# Patient Record
Sex: Female | Born: 1995
Health system: Southern US, Community
[De-identification: ages and names within clinical notes are randomized; demographics above are authoritative.]

## PROBLEM LIST (undated history)

## (undated) DIAGNOSIS — R519 Headache, unspecified: Secondary | ICD-10-CM

## (undated) DIAGNOSIS — R51 Headache: Secondary | ICD-10-CM

## (undated) HISTORY — DX: Headache: R51

## (undated) HISTORY — DX: Headache, unspecified: R51.9

---
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
## 2017-04-27 ENCOUNTER — Emergency Department
Admission: EM | Admit: 2017-04-27 | Discharge: 2017-04-27 | Disposition: A | Discharge status: Home / Self Care | Payer: 59 | Attending: Emergency Medicine | Admitting: Emergency Medicine

## 2017-04-27 ENCOUNTER — Emergency Department: Payer: 59

## 2017-04-27 DIAGNOSIS — R079 Chest pain, unspecified: Secondary | ICD-10-CM | POA: Insufficient documentation

## 2017-04-27 DIAGNOSIS — R0789 Other chest pain: Secondary | ICD-10-CM | POA: Diagnosis not present

## 2017-04-27 LAB — CBC
HCT: 33.8 % — ABNORMAL LOW (ref 35.0–47.0)
Hemoglobin: 10.8 g/dL — ABNORMAL LOW (ref 12.0–16.0)
MCH: 21 pg — ABNORMAL LOW (ref 26.0–34.0)
MCHC: 32.1 g/dL (ref 32.0–36.0)
MCV: 65.6 fL — ABNORMAL LOW (ref 80.0–100.0)
PLATELETS: 360 10*3/uL (ref 150–440)
RBC: 5.15 MIL/uL (ref 3.80–5.20)
RDW: 20.1 % — ABNORMAL HIGH (ref 11.5–14.5)
WBC: 5 10*3/uL (ref 3.6–11.0)

## 2017-04-27 LAB — BASIC METABOLIC PANEL
ANION GAP: 7 (ref 5–15)
BUN: 17 mg/dL (ref 6–20)
CALCIUM: 9.2 mg/dL (ref 8.9–10.3)
CO2: 25 mmol/L (ref 22–32)
CREATININE: 0.75 mg/dL (ref 0.44–1.00)
Chloride: 106 mmol/L (ref 101–111)
Glucose, Bld: 95 mg/dL (ref 65–99)
Potassium: 3.8 mmol/L (ref 3.5–5.1)
SODIUM: 138 mmol/L (ref 135–145)

## 2017-04-27 LAB — TROPONIN I

## 2017-04-27 NOTE — ED Triage Notes (Signed)
Pt c/o left sided squeezing chest pain intermittently for past week. Pt reports pain worsening when she yells. Pt alert and oriented X4, active, cooperative, pt in NAD. RR even and unlabored, color WNL.

## 2017-04-27 NOTE — ED Notes (Signed)
Pt states L sided CP x weeks. "I was supposed to come up here last week but I didn't" No distress noted. Denies SOB, N&V, diaphoresis. States chronic back pain. Alert, oriented, ambulatory. States pain worse when she yells or talks.

## 2017-04-27 NOTE — Discharge Instructions (Signed)
You have been seen in the emergency department today for chest pain. Your workup has shown normal results. As we discussed please follow-up with your primary care physician in the next 1-2 days for recheck. Return to the emergency department for any further chest pain, trouble breathing, or any other symptom personally concerning to yourself. °

## 2017-04-27 NOTE — ED Provider Notes (Signed)
[facility] Emergency Department Provider Note  Time seen: 3:10 PM  I have reviewed the triage vital signs and the nursing notes.   HISTORY  Chief Complaint Chest Pain    HPI Marissa Juarez is a 21 y.o. female with no past medical history who presents to the emergency department for left-sided chest discomfort. According to the patient for the past 1 week she's been having left-sided chest pain that comes and goes. Denies any cough or congestion. Denies any fever. Denies any abdominal pain, nausea or vomiting or diarrhea. Denies any leg pain or swelling. As the pain she has been experienced has mild in the left chest only. But currently gone.   History reviewed. No pertinent past medical history.  There are no active problems to display for this patient.   History reviewed. No pertinent surgical history.  Prior to Admission medications   Not on File    Allergies  Allergen Reactions  . Penicillins Rash    No family history on file.  Social History Social History  Substance Use Topics  . Smoking status: Never Smoker  . Smokeless tobacco: Never Used  . Alcohol use No    Review of Systems Constitutional: Negative for fever. Cardiovascular: Positive for left chest pain. Respiratory: Negative for shortness of breath. Gastrointestinal: Negative for abdominal pain Musculoskeletal: Negative for leg pain or swelling. Neurological: Negative for headache All other ROS negative  ____________________________________________   PHYSICAL EXAM:  VITAL SIGNS: ED Triage Vitals  Enc Vitals Group     BP 04/27/17 1219 111/69     Pulse Rate 04/27/17 1219 83     Resp 04/27/17 1219 18     Temp 04/27/17 1219 99 F (37.2 C)     Temp Source 04/27/17 1219 Oral     SpO2 04/27/17 1219 100 %     Weight 04/27/17 1220 146 lb (66.2 kg)     Height 04/27/17 1220 5\' 6"  (1.676 m)     Head Circumference --      Peak Flow --      Pain Score 04/27/17 1219 9      Pain Loc --      Pain Edu? --      Excl. in GC? --     Constitutional: Alert and oriented. Well appearing and in no distress. Eyes: Normal exam ENT   Head: Normocephalic and atraumatic.   Mouth/Throat: Mucous membranes are moist. Cardiovascular: Normal rate, regular rhythm. No murmur Respiratory: Normal respiratory effort without tachypnea nor retractions. Breath sounds are clear. Moderate left-sided chest tenderness with palpation/compression. No right-sided tenderness.  Gastrointestinal: Soft and nontender. No distention.  Musculoskeletal: Nontender with normal range of motion in all extremities. No lower extremity tenderness or edema. Neurologic:  Normal speech and language. No gross focal neurologic deficits Skin:  Skin is warm, dry and intact.  Psychiatric: Mood and affect are normal.   ____________________________________________    EKG  EKG reviewed and interpreted by myself shows normal sinus rhythm at 84 bpm, narrow QRS, normal axis, normal intervals, no ST changes. Normal EKG.  ____________________________________________    RADIOLOGY  Chest x-ray negative  ____________________________________________   INITIAL IMPRESSION / ASSESSMENT AND PLAN / ED COURSE  Pertinent labs & imaging results that were available during my care of the patient were reviewed by me and considered in my medical decision making (see chart for details).  Patient presents to the emergency department with intermittent left chest pain times one week. None currently. Overall the patient  appears very well with moderate reproducible left-sided chest discomfort. Normal workup including cardiac enzyme. Negative chest x-ray, normal EKG. Discussed likelihood of chest wall/muscle skeletal discomfort and recommended she take ibuprofen at home. Patient agreeable to this plan.  ____________________________________________   FINAL CLINICAL IMPRESSION(S) / ED DIAGNOSES  Left chest pain     Minna AntisPaduchowski, Carmell Elgin, MD 04/27/17 1512

## 2017-07-06 DIAGNOSIS — Z3200 Encounter for pregnancy test, result unknown: Secondary | ICD-10-CM | POA: Diagnosis not present

## 2017-07-06 DIAGNOSIS — Z23 Encounter for immunization: Secondary | ICD-10-CM | POA: Diagnosis not present

## 2017-07-06 DIAGNOSIS — Z202 Contact with and (suspected) exposure to infections with a predominantly sexual mode of transmission: Secondary | ICD-10-CM | POA: Diagnosis not present

## 2017-07-06 DIAGNOSIS — Z309 Encounter for contraceptive management, unspecified: Secondary | ICD-10-CM | POA: Diagnosis not present

## 2017-07-06 DIAGNOSIS — Z1159 Encounter for screening for other viral diseases: Secondary | ICD-10-CM | POA: Diagnosis not present

## 2017-09-23 DIAGNOSIS — Z1389 Encounter for screening for other disorder: Secondary | ICD-10-CM | POA: Diagnosis not present

## 2017-09-23 DIAGNOSIS — Z3200 Encounter for pregnancy test, result unknown: Secondary | ICD-10-CM | POA: Diagnosis not present

## 2017-09-23 DIAGNOSIS — Z113 Encounter for screening for infections with a predominantly sexual mode of transmission: Secondary | ICD-10-CM | POA: Diagnosis not present

## 2017-09-23 DIAGNOSIS — Z309 Encounter for contraceptive management, unspecified: Secondary | ICD-10-CM | POA: Diagnosis not present

## 2017-09-23 DIAGNOSIS — G44229 Chronic tension-type headache, not intractable: Secondary | ICD-10-CM | POA: Diagnosis not present

## 2017-10-03 ENCOUNTER — Encounter: Payer: Self-pay | Admitting: Neurology

## 2017-10-28 DIAGNOSIS — M9903 Segmental and somatic dysfunction of lumbar region: Secondary | ICD-10-CM | POA: Diagnosis not present

## 2017-10-28 DIAGNOSIS — M9905 Segmental and somatic dysfunction of pelvic region: Secondary | ICD-10-CM | POA: Diagnosis not present

## 2017-10-28 DIAGNOSIS — M5416 Radiculopathy, lumbar region: Secondary | ICD-10-CM | POA: Diagnosis not present

## 2017-10-28 DIAGNOSIS — M955 Acquired deformity of pelvis: Secondary | ICD-10-CM | POA: Diagnosis not present

## 2017-12-23 DIAGNOSIS — Z309 Encounter for contraceptive management, unspecified: Secondary | ICD-10-CM | POA: Diagnosis not present

## 2018-01-17 ENCOUNTER — Ambulatory Visit (INDEPENDENT_AMBULATORY_CARE_PROVIDER_SITE_OTHER): Payer: 59 | Admitting: Neurology

## 2018-01-17 ENCOUNTER — Encounter: Payer: Self-pay | Admitting: Neurology

## 2018-01-17 VITALS — BP 106/70 | HR 90 | Ht 68.0 in | Wt 138.0 lb

## 2018-01-17 DIAGNOSIS — G43009 Migraine without aura, not intractable, without status migrainosus: Secondary | ICD-10-CM | POA: Diagnosis not present

## 2018-01-17 MED ORDER — NORTRIPTYLINE HCL 10 MG PO CAPS: 10.0000 mg | ORAL_CAPSULE | Freq: Every day | ORAL | 2 refills | Status: DC

## 2018-01-17 NOTE — Patient Instructions (Signed)
Migraine Recommendations: 1.  Start nortriptyline 10mg  at bedtime.  If headaches not improved, call in 4 weeks with update and we can adjust dose if needed. 2.  Take either Excedrin, ibuprofen or Tylenol as needed but limit use of pain relievers to no more than 2 days out of week (5 days out of week, you shouldn't take a pain reliever). 3.  Limit use of pain relievers to no more than 2 days out of the week.  These medications include acetaminophen, ibuprofen, triptans and narcotics.  This will help reduce risk of rebound headaches. 4.  Be aware of common food triggers such as processed sweets, processed foods with nitrites (such as deli meat, hot dogs, sausages), foods with MSG, alcohol (such as wine), chocolate, certain cheeses, certain fruits (dried fruits, bananas, some citrus fruit), vinegar, diet soda. 4.  Avoid caffeine 5.  Routine exercise 6.  Proper sleep hygiene 7.  Stay adequately hydrated with water 8.  Keep a headache diary. 9.  Maintain proper stress management. 10.  Do not skip meals. 11.  Consider supplements:  Magnesium citrate 400mg  to 600mg  daily, riboflavin 400mg , Coenzyme Q 10 100mg  three times daily 12.  Follow up in 3 months.

## 2018-01-17 NOTE — Progress Notes (Signed)
NEUROLOGY CONSULTATION NOTE  Marissa PillowLeiasia N Gerald MRN: [id number] DOB: [birth year]  Referring provider: Toy CookeyEmily Headrick, FNP Primary care provider: Toy CookeyEmily Headrick, FNP  Reason for consult:  headache  HISTORY OF PRESENT ILLNESS: Marissa Juarez is a 22 year old female who presents for headache.  History supplemented by referring provider's note.  Onset:  Since childhood but has increased in frequency over past few months. Location:  Left frontal region (above left eye) Quality:  Throbbing/pounding Intensity:  Severe.  she denies new headache, thunderclap headache or severe headache that wakes her from sleep. Aura:  no Prodrome:  no Postdrome:  no Associated symptoms:  Nausea, photophobia, phonophobia.  Rarely associated with vomiting.  Rarely associated with blurred vision only while driving.  She denies associated unilateral numbness or weakness. Duration:  2 hours Frequency:  Initially once a week but daily over past 1 to 2 months Frequency of abortive medication: Excedrin daily Triggers/exacerbating factors:  Unknown but possibly stress.  Started Depo-Provera around January. Relieving factors:  sleep Activity:  aggravates  Current NSAIDS:  ibuprofen Current analgesics:  Excedrin, Tylenol Current triptans:  no Current anti-emetic:  no Current muscle relaxants:  no Current anti-anxiolytic:  no Current sleep aide:  no Current Antihypertensive medications:  no Current Antidepressant medications:  no Current Anticonvulsant medications:  no Current Vitamins/Herbal/Supplements:  no Current Antihistamines/Decongestants:  no Other therapy:  no Birth Control:  Depo-Provera  Past NSAIDS:  no Past analgesics:  no Past abortive triptans:  no Past muscle relaxants:  no Past anti-emetic:  no Past antihypertensive medications:  no Past antidepressant medications:  no Past anticonvulsant medications:  no Past vitamins/Herbal/Supplements:  no Past antihistamines/decongestants:   no Other past therapies:  no  Caffeine:  Occasional coffee Alcohol:  no Smoker:  no Diet:  Drinks 16 oz water daily.  Does not drink soda Exercise:  no Depression:  no; Anxiety:  no Other pain:  Sometimes back pain Sleep hygiene:  good Family history of headache:  Mom, cousins have migraines  Labs over past year: 04/27/17 BMP with Na 138, K 3.8, Cl 106, Co2 25, glucose 95, BUN 17 and Cr 0.75  PAST MEDICAL HISTORY: Past Medical History:  Diagnosis Date  . Headache     PAST SURGICAL HISTORY: History reviewed. No pertinent surgical history.  MEDICATIONS: Current Outpatient Medications on File Prior to Visit  Medication Sig Dispense Refill  . estradiol cypionate (DEPO-ESTRADIOL) 5 MG/ML injection Inject into the muscle every 28 (twenty-eight) days.     No current facility-administered medications on file prior to visit.     ALLERGIES: Allergies  Allergen Reactions  . Penicillins Rash    FAMILY HISTORY: Family History  Problem Relation Age of Onset  . Heart murmur Sister   . Epilepsy Brother   . Dementia Maternal Grandmother   . Diabetes Maternal Grandfather   . Hypertension Paternal Grandmother   . Diabetes Paternal Grandmother   . Diabetes Paternal Grandfather     SOCIAL HISTORY: Social History   Socioeconomic History  . Marital status: Single    Spouse name: Not on file  . Number of children: Not on file  . Years of education: Not on file  . Highest education level: Some college, no degree  Occupational History  . Occupation: Consulting civil engineerstudent  Social Needs  . Financial resource strain: Not on file  . Food insecurity:    Worry: Not on file    Inability: Not on file  . Transportation needs:    Medical: Not on file  Non-medical: Not on file  Tobacco Use  . Smoking status: Never Smoker  . Smokeless tobacco: Never Used  Substance and Sexual Activity  . Alcohol use: No  . Drug use: Never  . Sexual activity: Not on file  Lifestyle  . Physical activity:     Days per week: Not on file    Minutes per session: Not on file  . Stress: Not on file  Relationships  . Social connections:    Talks on phone: Not on file    Gets together: Not on file    Attends religious service: Not on file    Active member of club or organization: Not on file    Attends meetings of clubs or organizations: Not on file    Relationship status: Not on file  . Intimate partner violence:    Fear of current or ex partner: Not on file    Emotionally abused: Not on file    Physically abused: Not on file    Forced sexual activity: Not on file  Other Topics Concern  . Not on file  Social History Narrative   Pt is left handed. She is single, lives with her mother, brother and sister. Drinks coffee and tea occasionally. No regular exercise recently. She is in school, studying for LPN.    REVIEW OF SYSTEMS: Constitutional: No fevers, chills, or sweats, no generalized fatigue, change in appetite Eyes: No visual changes, double vision, eye pain Ear, nose and throat: No hearing loss, ear pain, nasal congestion, sore throat Cardiovascular: No chest pain, palpitations Respiratory:  No shortness of breath at rest or with exertion, wheezes GastrointestinaI: No nausea, vomiting, diarrhea, abdominal pain, fecal incontinence Genitourinary:  No dysuria, urinary retention or frequency Musculoskeletal:  No neck pain, back pain Integumentary: No rash, pruritus, skin lesions Neurological: as above Psychiatric: No depression, insomnia, anxiety Endocrine: No palpitations, fatigue, diaphoresis, mood swings, change in appetite, change in weight, increased thirst Hematologic/Lymphatic:  No purpura, petechiae. Allergic/Immunologic: no itchy/runny eyes, nasal congestion, recent allergic reactions, rashes  PHYSICAL EXAM: Vitals:   01/17/18 0806  BP: 106/70  Pulse: 90  SpO2: 98%   General: No acute distress.  Patient appears well-groomed.  Head:  Normocephalic/atraumatic Eyes:  fundi  examined but not visualized Neck: supple, no paraspinal tenderness, full range of motion Back: No paraspinal tenderness Heart: regular rate and rhythm Lungs: Clear to auscultation bilaterally. Vascular: No carotid bruits. Neurological Exam: Mental status: alert and oriented to person, place, and time, recent and remote memory intact, fund of knowledge intact, attention and concentration intact, speech fluent and not dysarthric, language intact. Cranial nerves: CN I: not tested CN II: pupils equal, round and reactive to light, visual fields intact CN III, IV, VI:  full range of motion, no nystagmus, no ptosis CN V: facial sensation intact CN VII: upper and lower face symmetric CN VIII: hearing intact CN IX, X: gag intact, uvula midline CN XI: sternocleidomastoid and trapezius muscles intact CN XII: tongue midline Bulk & Tone: normal, no fasciculations. Motor:  5/5 throughout  Sensation: temperature and vibration sensation intact. Deep Tendon Reflexes:  2+ throughout, toes downgoing.  Finger to nose testing:  Without dysmetria.  Heel to shin:  Without dysmetria.  Gait:  Normal station and stride.  Able to turn and tandem walk. Romberg negative.  IMPRESSION: Migraine without aura, increased frequency over past several weeks.  Possibly triggered by stress.  Also consider Depo a trigger.  PLAN: At this time, she would like to start a daily  preventative. 1.  Start nortriptyline 10mg  at bedtime.  If headaches not improved, call in 4 weeks with update and we can adjust dose if needed. 2.  Take either Excedrin, ibuprofen or Tylenol as needed but limit use of pain relievers to no more than 2 days out of week to prevent rebound headache. 3.  Be aware of common food triggers such as processed sweets, processed foods with nitrites (such as deli meat, hot dogs, sausages), foods with MSG, alcohol (such as wine), chocolate, certain cheeses, certain fruits (dried fruits, bananas, some citrus fruit),  vinegar, diet soda. 4.  Avoid caffeine 5.  Routine exercise 6.  Proper sleep hygiene 7.  Stay adequately hydrated with water 8.  Keep a headache diary. 9.  Maintain proper stress management. 10.  Do not skip meals. 11.  Consider supplements:  Magnesium citrate 400mg  to 600mg  daily, riboflavin 400mg , Coenzyme Q 10 100mg  three times daily 12.  Follow up in 3 months.  Thank you for allowing me to take part in the care of this patient.  Shon Millet, DO  CC:  Toy Cookey, FNP

## 2018-01-24 ENCOUNTER — Encounter: Payer: Self-pay | Admitting: Neurology

## 2018-02-13 ENCOUNTER — Telehealth: Payer: Self-pay | Admitting: Neurology

## 2018-02-13 DIAGNOSIS — G43009 Migraine without aura, not intractable, without status migrainosus: Secondary | ICD-10-CM

## 2018-02-13 NOTE — Telephone Encounter (Signed)
LMOVM for Pt to rtrn the call

## 2018-02-13 NOTE — Telephone Encounter (Signed)
Patient needs to talk to someone about medication dosage and the headaches she is having. Patient is taking nortiptyline

## 2018-02-15 MED ORDER — NORTRIPTYLINE HCL 25 MG PO CAPS
25.0000 mg | ORAL_CAPSULE | Freq: Every day | ORAL | 3 refills | Status: DC
Start: 2018-02-15 — End: 2018-05-04

## 2018-02-15 NOTE — Telephone Encounter (Signed)
Called and spoke with Pt. She wanted to increase nortriptyline as discussed at last visit. Spoke with Dr Everlena Cooper, he recommended increasing to 25 mg, advised Pt, sent to [facility] on [address] in [place]

## 2018-02-15 NOTE — Addendum Note (Signed)
Addended by: Dorthy Cooler on: 02/15/2018 03:51 PM   Modules accepted: Orders

## 2018-03-13 DIAGNOSIS — Z309 Encounter for contraceptive management, unspecified: Secondary | ICD-10-CM | POA: Diagnosis not present

## 2018-05-04 ENCOUNTER — Encounter: Payer: Self-pay | Admitting: Neurology

## 2018-05-04 ENCOUNTER — Ambulatory Visit (INDEPENDENT_AMBULATORY_CARE_PROVIDER_SITE_OTHER): Payer: 59 | Admitting: Neurology

## 2018-05-04 VITALS — BP 112/74 | HR 104 | Ht 67.75 in | Wt 151.0 lb

## 2018-05-04 DIAGNOSIS — G43709 Chronic migraine without aura, not intractable, without status migrainosus: Secondary | ICD-10-CM | POA: Diagnosis not present

## 2018-05-04 MED ORDER — NORTRIPTYLINE HCL 50 MG PO CAPS: 50.0000 mg | ORAL_CAPSULE | Freq: Every day | ORAL | 3 refills | Status: DC

## 2018-05-04 NOTE — Progress Notes (Signed)
NEUROLOGY FOLLOW UP OFFICE NOTE  Marissa Juarez [id number]  HISTORY OF PRESENT ILLNESS: Marissa Juarez is a 22 year old female who follows up for migraine.  UPDATE: Nortriptyline started in April and was subsequently increased to 25mg  at bedtime. Intensity:  severe Duration:  2 hours Frequency:  Every other day Frequency of abortive medication: Excedrin once a week Current NSAIDS:  no Current analgesics:  Excedrin Current triptans:  no Current anti-emetic:  no Current muscle relaxants:  no Current anti-anxiolytic:  no Current sleep aide:  no Current Antihypertensive medications:  no Current Antidepressant medications:  nortriptyline 25mg  Current Anticonvulsant medications:  no Current Vitamins/Herbal/Supplements:  no Current Antihistamines/Decongestants:  no Other therapy:  no Birth Control:  Depo-Provera  Caffeine:  Occasional coffee Alcohol:  no Smoker:  no Diet:  Drinks 16 oz water daily.  Does not drink soda Exercise:  no Depression:  no; Anxiety:  no Other pain:  Sometimes back pain Sleep hygiene:  good  HISTORY:  Onset:  Since childhood but has increased in frequency over past few months. Location:  Left frontal region (above left eye) Quality:  Throbbing/pounding Initial Intensity:  Severe.  she denies new headache, thunderclap headache or severe headache that wakes her from sleep. Aura:  no Prodrome:  no Postdrome:  no Associated symptoms:  Nausea, photophobia, phonophobia.  Rarely associated with vomiting.  Rarely associated with blurred vision only while driving.  She denies associated unilateral numbness or weakness. Initial Duration:  2 hours Initial Frequency:  Initially once a week but daily over past 1 to 2 months Initial Frequency of abortive medication: Excedrin daily Triggers/exacerbating factors:  Unknown but possibly stress.  Started Depo-Provera around January. Relieving factors:  sleep Activity:  aggravates    Past NSAIDS:  no Past  analgesics:  no Past abortive triptans:  no Past muscle relaxants:  no Past anti-emetic:  no Past antihypertensive medications:  no Past antidepressant medications:  no Past anticonvulsant medications:  no Past vitamins/Herbal/Supplements:  no Past antihistamines/decongestants:  no Other past therapies:  no   Family history of headache:  Mom, cousins have migraines  PAST MEDICAL HISTORY: Past Medical History:  Diagnosis Date  . Headache     MEDICATIONS: Current Outpatient Medications on File Prior to Visit  Medication Sig Dispense Refill  . estradiol cypionate (DEPO-ESTRADIOL) 5 MG/ML injection Inject into the muscle every 28 (twenty-eight) days.     No current facility-administered medications on file prior to visit.     ALLERGIES: Allergies  Allergen Reactions  . Penicillins Rash    FAMILY HISTORY: Family History  Problem Relation Age of Onset  . Heart murmur Sister   . Epilepsy Brother   . Dementia Maternal Grandmother   . Diabetes Maternal Grandfather   . Hypertension Paternal Grandmother   . Diabetes Paternal Grandmother   . Diabetes Paternal Grandfather     SOCIAL HISTORY: Social History   Socioeconomic History  . Marital status: Single    Spouse name: Not on file  . Number of children: Not on file  . Years of education: Not on file  . Highest education level: Some college, no degree  Occupational History  . Occupation: Consulting civil engineerstudent  Social Needs  . Financial resource strain: Not on file  . Food insecurity:    Worry: Not on file    Inability: Not on file  . Transportation needs:    Medical: Not on file    Non-medical: Not on file  Tobacco Use  . Smoking status: Never  Smoker  . Smokeless tobacco: Never Used  Substance and Sexual Activity  . Alcohol use: No  . Drug use: Never  . Sexual activity: Not on file  Lifestyle  . Physical activity:    Days per week: Not on file    Minutes per session: Not on file  . Stress: Not on file  Relationships    . Social connections:    Talks on phone: Not on file    Gets together: Not on file    Attends religious service: Not on file    Active member of club or organization: Not on file    Attends meetings of clubs or organizations: Not on file    Relationship status: Not on file  . Intimate partner violence:    Fear of current or ex partner: Not on file    Emotionally abused: Not on file    Physically abused: Not on file    Forced sexual activity: Not on file  Other Topics Concern  . Not on file  Social History Narrative   Pt is left handed. She is single, lives with her mother, brother and sister. Drinks coffee and tea occasionally. No regular exercise recently. She is in school, studying for LPN.    REVIEW OF SYSTEMS: Constitutional: No fevers, chills, or sweats, no generalized fatigue, change in appetite Eyes: No visual changes, double vision, eye pain Ear, nose and throat: No hearing loss, ear pain, nasal congestion, sore throat Cardiovascular: No chest pain, palpitations Respiratory:  No shortness of breath at rest or with exertion, wheezes GastrointestinaI: No nausea, vomiting, diarrhea, abdominal pain, fecal incontinence Genitourinary:  No dysuria, urinary retention or frequency Musculoskeletal:  No neck pain, back pain Integumentary: No rash, pruritus, skin lesions Neurological: as above Psychiatric: No depression, insomnia, anxiety Endocrine: No palpitations, fatigue, diaphoresis, mood swings, change in appetite, change in weight, increased thirst Hematologic/Lymphatic:  No purpura, petechiae. Allergic/Immunologic: no itchy/runny eyes, nasal congestion, recent allergic reactions, rashes  PHYSICAL EXAM: Vitals:   05/04/18 0943  BP: 112/74  Pulse: (!) 104  SpO2: 98%   General: No acute distress.  Patient appears well-groomed.  normal body habitus. Head:  Normocephalic/atraumatic Eyes:  Fundi examined but not visualized Neck: supple, no paraspinal tenderness, full range  of motion Heart:  Regular rate and rhythm Lungs:  Clear to auscultation bilaterally Back: No paraspinal tenderness Neurological Exam: alert and oriented to person, place, and time. Attention span and concentration intact, recent and remote memory intact, fund of knowledge intact.  Speech fluent and not dysarthric, language intact.  CN II-XII intact. Bulk and tone normal, muscle strength 5/5 throughout.  Sensation to light touch  intact.  Deep tendon reflexes 2+ throughout.  Finger to nose testing intact.  Gait normal  IMPRESSION: Chronic migraine without aura, without status migrainosus, not intractable  PLAN: 1.  Increase nortriptyline to 50mg  at bedtime.  Contact me in 4 weeks with update and we can adjust dose if needed. 2.  Excedrin for abortive therapy 3.  Limit use of pain relievers to no more than 2 days out of the week.  These medications include acetaminophen, ibuprofen, triptans and narcotics.  This will help reduce risk of rebound headaches. 4.  Be aware of common food triggers such as processed sweets, processed foods with nitrites (such as deli meat, hot dogs, sausages), foods with MSG, alcohol (such as wine), chocolate, certain cheeses, certain fruits (dried fruits, bananas, some citrus fruit), vinegar, diet soda. 4.  Avoid caffeine 5.  Routine exercise 6.  Proper sleep hygiene 7.  Stay adequately hydrated with water 8.  Keep a headache diary. 9.  Maintain proper stress management. 10.  Do not skip meals. 11.  Consider supplements:  Magnesium citrate 400mg  to 600mg  daily, riboflavin 400mg , Coenzyme Q 10 100mg  three times daily 12.  Follow up in 3 to 4 months.  Shon Millet, DO

## 2018-05-04 NOTE — Patient Instructions (Signed)
Migraine Recommendations: 1.  Increase nortriptyline to 50mg  at bedtime.  New prescription ordered.  Contact me in 4 weeks with update and we can adjust dose or switch medication if needed. 2.  Take Excedrin for migraines if needed, otherwise just rest 3.  Limit use of pain relievers to no more than 2 days out of the week.  These medications include acetaminophen, ibuprofen, triptans and narcotics.  This will help reduce risk of rebound headaches. 4.  Be aware of common food triggers such as processed sweets, processed foods with nitrites (such as deli meat, hot dogs, sausages), foods with MSG, alcohol (such as wine), chocolate, certain cheeses, certain fruits (dried fruits, bananas, some citrus fruit), vinegar, diet soda. 4.  Avoid caffeine 5.  Routine exercise 6.  Proper sleep hygiene 7.  Stay adequately hydrated with water 8.  Keep a headache diary. 9.  Maintain proper stress management. 10.  Do not skip meals. 11.  Consider supplements:  Magnesium citrate 400mg  to 600mg  daily, riboflavin 400mg , Coenzyme Q 10 100mg  three times daily 12.  Follow up in 3 to 4 months

## 2018-06-08 DIAGNOSIS — Z309 Encounter for contraceptive management, unspecified: Secondary | ICD-10-CM | POA: Diagnosis not present

## 2018-08-07 ENCOUNTER — Ambulatory Visit: Payer: 59 | Admitting: Neurology

## 2018-08-14 ENCOUNTER — Encounter: Payer: Self-pay | Admitting: Neurology

## 2018-08-14 ENCOUNTER — Ambulatory Visit (INDEPENDENT_AMBULATORY_CARE_PROVIDER_SITE_OTHER): Payer: 59 | Admitting: Neurology

## 2018-08-14 VITALS — BP 118/72 | HR 116 | Ht 68.0 in | Wt 167.0 lb

## 2018-08-14 DIAGNOSIS — G43009 Migraine without aura, not intractable, without status migrainosus: Secondary | ICD-10-CM

## 2018-08-14 MED ORDER — NORTRIPTYLINE HCL 75 MG PO CAPS: 75.0000 mg | ORAL_CAPSULE | Freq: Every day | ORAL | 0 refills | Status: DC

## 2018-08-14 NOTE — Progress Notes (Signed)
NEUROLOGY FOLLOW UP OFFICE NOTE  Marissa Juarez [id number]  HISTORY OF PRESENT ILLNESS: Marissa Juarez is a 22 year old female who follows up for migraine.  UPDATE: Intensity:  severe Duration:  2 hours Frequency:  2-3 days a week Frequency of abortive medication: Excedrin once a week Current NSAIDS:  no Current analgesics:  Excedrin Current triptans:  no Current anti-emetic:  no Current muscle relaxants:  no Current anti-anxiolytic:  no Current sleep aide:  no Current Antihypertensive medications:  no Current Antidepressant medications:  nortriptyline 50mg  Current Anticonvulsant medications:  no Current Vitamins/Herbal/Supplements:  no Current Antihistamines/Decongestants:  no Other therapy:  no Birth Control:  Depo-Provera  Caffeine:  Occasional coffee Alcohol:  no Smoker:  no Diet:  Drinks 32 oz water daily.  Does not drink soda Exercise:  no Depression:  no; Anxiety:  no Other pain:  Sometimes back pain Sleep hygiene:  good  HISTORY: Onset: Since childhood but has increased in frequency over past few months. Location: Left frontal region (above left eye) Quality:  Throbbing/pounding Initial Intensity:  Severe.  she denies new headache, thunderclap headache or severe headache that wakes her from sleep. Aura:  no Prodrome:  no Postdrome:  no Associated symptoms: Nausea, photophobia, phonophobia.  Rarely associated with vomiting.  Rarely associated with blurred vision only while driving.  She denies associated unilateral numbness or weakness. Initial Duration:  2 hours Initial Frequency:  Initially once a week but daily over past 1 to 2 months Initial Frequency of abortive medication: Excedrin daily Triggers: Unknown but possibly stress.  Relieving factors:  sleep Activity:  aggravates  Past NSAIDS:  no Past analgesics:  no Past abortive triptans:  no Past muscle relaxants:  no Past anti-emetic:  no Past antihypertensive medications:  no Past  antidepressant medications:  no Past anticonvulsant medications:  no Past vitamins/Herbal/Supplements:  no Past antihistamines/decongestants:  no Other past therapies:  no  Family history of headache:  Mom, cousins have migraines  PAST MEDICAL HISTORY: Past Medical History:  Diagnosis Date  . Headache     MEDICATIONS: Current Outpatient Medications on File Prior to Visit  Medication Sig Dispense Refill  . estradiol cypionate (DEPO-ESTRADIOL) 5 MG/ML injection Inject into the muscle every 28 (twenty-eight) days.    . nortriptyline (PAMELOR) 50 MG capsule Take 1 capsule (50 mg total) by mouth at bedtime. 30 capsule 3   No current facility-administered medications on file prior to visit.     ALLERGIES: Allergies  Allergen Reactions  . Penicillins Rash    FAMILY HISTORY: Family History  Problem Relation Age of Onset  . Heart murmur Sister   . Epilepsy Brother   . Dementia Maternal Grandmother   . Diabetes Maternal Grandfather   . Hypertension Paternal Grandmother   . Diabetes Paternal Grandmother   . Diabetes Paternal Grandfather    SOCIAL HISTORY: Social History   Socioeconomic History  . Marital status: Single    Spouse name: Not on file  . Number of children: Not on file  . Years of education: Not on file  . Highest education level: Some college, no degree  Occupational History  . Occupation: Consulting civil engineer  Social Needs  . Financial resource strain: Not on file  . Food insecurity:    Worry: Not on file    Inability: Not on file  . Transportation needs:    Medical: Not on file    Non-medical: Not on file  Tobacco Use  . Smoking status: Never Smoker  . Smokeless tobacco: Never Used  Substance and Sexual Activity  . Alcohol use: No  . Drug use: Never  . Sexual activity: Not on file  Lifestyle  . Physical activity:    Days per week: Not on file    Minutes per session: Not on file  . Stress: Not on file  Relationships  . Social connections:    Talks on  phone: Not on file    Gets together: Not on file    Attends religious service: Not on file    Active member of club or organization: Not on file    Attends meetings of clubs or organizations: Not on file    Relationship status: Not on file  . Intimate partner violence:    Fear of current or ex partner: Not on file    Emotionally abused: Not on file    Physically abused: Not on file    Forced sexual activity: Not on file  Other Topics Concern  . Not on file  Social History Narrative   Pt is left handed. She is single, lives with her mother, brother and sister. Drinks coffee and tea occasionally. No regular exercise recently. She is in school, studying for LPN.    REVIEW OF SYSTEMS: Constitutional: No fevers, chills, or sweats, no generalized fatigue, change in appetite Eyes: No visual changes, double vision, eye pain Ear, nose and throat: No hearing loss, ear pain, nasal congestion, sore throat Cardiovascular: No chest pain, palpitations Respiratory:  No shortness of breath at rest or with exertion, wheezes GastrointestinaI: No nausea, vomiting, diarrhea, abdominal pain, fecal incontinence Genitourinary:  No dysuria, urinary retention or frequency Musculoskeletal:  No neck pain, back pain Integumentary: No rash, pruritus, skin lesions Neurological: as above Psychiatric: No depression, insomnia, anxiety Endocrine: No palpitations, fatigue, diaphoresis, mood swings, change in appetite, change in weight, increased thirst Hematologic/Lymphatic:  No purpura, petechiae. Allergic/Immunologic: no itchy/runny eyes, nasal congestion, recent allergic reactions, rashes  PHYSICAL EXAM: Blood pressure 118/72, pulse (!) 116, height 5\' 8"  (1.727 m), weight 167 lb (75.8 kg), SpO2 97 %. General: No acute distress.  Patient appears well-groomed.   Head:  Normocephalic/atraumatic Eyes:  Fundi examined but not visualized Neck: supple, no paraspinal tenderness, full range of motion Heart:  Regular  rate and rhythm Lungs:  Clear to auscultation bilaterally Back: No paraspinal tenderness Neurological Exam: alert and oriented to person, place, and time. Attention span and concentration intact, recent and remote memory intact, fund of knowledge intact.  Speech fluent and not dysarthric, language intact.  CN II-XII intact. Bulk and tone normal, muscle strength 5/5 throughout.  Sensation to light touch intact.  Deep tendon reflexes 2+ throughout, toes downgoing.  Finger to nose testing intact.  Gait normal, Romberg negative.  IMPRESSION: Migraine without aura, without status migrainosus, not intractable  PLAN: 1.  For preventative management, we will increase nortriptyline to 75mg  at bedtime to achieve better migraine control.  We can increase dose to 100mg  at bedtime in 6 weeks if needed. 2.  For abortive therapy, Excedrin 3.  Limit use of pain relievers to no more than 2 days out of week to prevent risk of rebound or medication-overuse headache. 4.  Keep headache diary 5.  Exercise, hydration, caffeine cessation, sleep hygiene, monitor for and avoid triggers 6.  Consider:  magnesium citrate 400mg  daily, riboflavin 400mg  daily, and coenzyme Q10 100mg  three times daily 7.  Follow up in 4 months.  Shon MilletAdam Holton Sidman, DO

## 2018-08-14 NOTE — Patient Instructions (Signed)
1.  We will increase nortriptyline to 75mg  at bedtime.  Contact me in 6 weeks with update and we can increase dose if needed. 2.  Continue Excedrin for migraine attack.  Limit use of pain relievers to no more than 2 days out of week to prevent risk of rebound or medication-overuse headache. 3.  Keep headache diary  4.  Follow up in 4 months.
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
## 2018-09-07 DIAGNOSIS — Z309 Encounter for contraceptive management, unspecified: Secondary | ICD-10-CM | POA: Diagnosis not present

## 2018-09-07 DIAGNOSIS — Z3200 Encounter for pregnancy test, result unknown: Secondary | ICD-10-CM | POA: Diagnosis not present

## 2018-09-07 DIAGNOSIS — Z1389 Encounter for screening for other disorder: Secondary | ICD-10-CM | POA: Diagnosis not present

## 2018-11-07 ENCOUNTER — Other Ambulatory Visit: Payer: Self-pay | Admitting: Neurology

## 2018-11-23 DIAGNOSIS — Z3200 Encounter for pregnancy test, result unknown: Secondary | ICD-10-CM | POA: Diagnosis not present

## 2018-11-23 DIAGNOSIS — Z309 Encounter for contraceptive management, unspecified: Secondary | ICD-10-CM | POA: Diagnosis not present

## 2018-12-24 NOTE — Progress Notes (Signed)
Virtual Visit via Video Note The purpose of this virtual visit is to provide medical care while limiting exposure to the novel coronavirus.    Consent was obtained for video visit:  Yes.   Answered questions that patient had about telehealth interaction:  Yes.   I discussed the limitations, risks, security and privacy concerns of performing an evaluation and management service by telemedicine. I also discussed with the patient that there may be a patient responsible charge related to this service. The patient expressed understanding and agreed to proceed.  Pt location: Home Physician Location: office Name of referring provider:  No ref. provider found I connected with Marissa Juarez at patients initiation/request on 12/25/2018 at  2:00 PM EDT by video enabled telemedicine application and verified that I am speaking with the correct person using two identifiers. Pt MRN:  [id number] Pt DOB:  01/14/1996   History of Present Illness:  Marissa Juarez is a 23 year old female who follows up for migraine.  UPDATE: Intensity:moderate to severe Duration:2 hours Frequency:1 to 2 days a month Frequency of abortive medication:Excedrin less than once a week Current NSAIDS:no Current analgesics: Excedrin Current triptans: no Current anti-emetic: no Current muscle relaxants: no Current anti-anxiolytic: no Current sleep aide: no Current Antihypertensive medications: no Current Antidepressant medications: nortriptyline 75mg  Current Anticonvulsant medications: no Current Vitamins/Herbal/Supplements: no Current Antihistamines/Decongestants: no Other therapy: no Birth Control: Depo-Provera  Caffeine: Occasional coffee Alcohol: no Smoker: no Diet: Drinks 32 oz water daily. Does not drink soda Exercise: no Depression: no; Anxiety: no Other pain: Sometimes back pain Sleep hygiene: good  HISTORY: Onset: Since childhood but has increased in frequency  over past few months. Location: Left frontal region (above left eye) Quality: Throbbing/pounding Initial Intensity: Severe. she denies new headache, thunderclap headache or severe headache that wakes her from sleep. Aura: no Prodrome: no Postdrome: no Associated symptoms: Nausea, photophobia, phonophobia. Rarely associated with vomiting. Rarely associated with blurred vision only while driving. She denies associated unilateral numbness or weakness. Initial Duration: 2 hours Initial Frequency: Initially once a week but daily over past 1 to 2 months Initial Frequency of abortive medication: Excedrin daily Triggers: Unknown but possibly stress.  Relieving factors: sleep Activity: aggravates  Past NSAIDS: no Past analgesics: no Past abortive triptans: no Past muscle relaxants: no Past anti-emetic: no Past antihypertensive medications: no Past antidepressant medications: no Past anticonvulsant medications: no Past vitamins/Herbal/Supplements: no Past antihistamines/decongestants: no Other past therapies: no  Family history of headache: Mom, cousins have migraines    Observations/Objective:   alert and oriented to person, place, and time. Attention span and concentration intact, recent and remote memory intact, fund of knowledge intact.  Speech fluent and not dysarthric, language intact.  Pupils equal and round.  Eyes orthophoric on primary gaze and move in all directions.  Face symmetric.  Facial sensation intact.  Tongue midline. No pronator drift. Moves all extremities against gravity. Finger to nose testing intact.  Gait normal, Romberg negative  Assessment and Plan:   Migraine without aura, without status migrainosus, not intractable  1.  For preventative management, continue nortriptyline 75mg  at bedtime.  Refilled today. 2.  For abortive therapy, Excedrin 3.  Limit use of pain relievers to no more than 2 days out of week to prevent risk of rebound or  medication-overuse headache. 4.  Keep headache diary 5.  Exercise, hydration, caffeine cessation, sleep hygiene, monitor for and avoid triggers 6.  Consider:  magnesium citrate 400mg  daily, riboflavin 400mg  daily, and coenzyme Q10 100mg  three times  daily 7.  Follow up in 6 months.   Follow Up Instructions:    -I discussed the assessment and treatment plan with the patient. The patient was provided an opportunity to ask questions and all were answered. The patient agreed with the plan and demonstrated an understanding of the instructions.   The patient was advised to call back or seek an in-person evaluation if the symptoms worsen or if the condition fails to improve as anticipated.    Total Time spent in visit with the patient was:  6 minutes, of which more than 50% of the time was spent in counseling and/or coordinating care on plan.   Pt understands and agrees with the plan of care outlined.     Cira Servant, DO

## 2018-12-25 ENCOUNTER — Other Ambulatory Visit: Payer: Self-pay

## 2018-12-25 ENCOUNTER — Encounter: Payer: Self-pay | Admitting: Neurology

## 2018-12-25 ENCOUNTER — Telehealth (INDEPENDENT_AMBULATORY_CARE_PROVIDER_SITE_OTHER): Payer: 59 | Admitting: Neurology

## 2018-12-25 ENCOUNTER — Ambulatory Visit: Payer: 59 | Admitting: Neurology

## 2018-12-25 DIAGNOSIS — G43009 Migraine without aura, not intractable, without status migrainosus: Secondary | ICD-10-CM | POA: Diagnosis not present

## 2018-12-25 MED ORDER — NORTRIPTYLINE HCL 75 MG PO CAPS: 75.0000 mg | ORAL_CAPSULE | Freq: Every day | ORAL | 5 refills | Status: DC

## 2018-12-25 NOTE — Patient Instructions (Signed)
1.  For preventative management, nortriptyline 75mg  at bedtime 2.  For abortive therapy, Excedrin 3.  Limit use of pain relievers to no more than 2 days out of week to prevent risk of rebound or medication-overuse headache. 4.  Keep headache diary 5.  Exercise, hydration, caffeine cessation, sleep hygiene, monitor for and avoid triggers 6.  Consider:  magnesium citrate 400mg  daily, riboflavin 400mg  daily, and coenzyme Q10 100mg  three times daily 7.  Follow up in 6 months.

## 2018-12-25 NOTE — Addendum Note (Signed)
Addended byEverlena Cooper, Shamara Soza R on: 12/25/2018 02:10 PM   Modules accepted: Orders

## 2018-12-27 ENCOUNTER — Telehealth: Payer: Self-pay | Admitting: Neurology

## 2018-12-27 NOTE — Telephone Encounter (Signed)
Called and LMOVM advising Pt nortriptyline (the only medication we Rx for her) was sent in on 12/25/18 with 5 refills to [facility] in [place].

## 2018-12-27 NOTE — Telephone Encounter (Signed)
Patient need to get a refill on her medication ( she does not know the name of it ) sent to the [facility] in [place]

## 2019-01-08 ENCOUNTER — Telehealth: Payer: Self-pay | Admitting: Neurology

## 2019-01-08 NOTE — Telephone Encounter (Signed)
Nortriptyline 75mg - refill to the pharm on file. She said that the Pharm told her to call us to get a refill because this wasn't sent in. Thanks!

## 2019-01-08 NOTE — Telephone Encounter (Signed)
This was sent in on 12/15/2018 with 5 refills.

## 2019-02-09 DIAGNOSIS — Z3200 Encounter for pregnancy test, result unknown: Secondary | ICD-10-CM | POA: Diagnosis not present

## 2019-02-09 DIAGNOSIS — Z309 Encounter for contraceptive management, unspecified: Secondary | ICD-10-CM | POA: Diagnosis not present

## 2019-04-26 DIAGNOSIS — Z309 Encounter for contraceptive management, unspecified: Secondary | ICD-10-CM | POA: Diagnosis not present

## 2019-06-29 ENCOUNTER — Ambulatory Visit: Payer: 59 | Admitting: Neurology

## 2019-07-02 NOTE — Progress Notes (Signed)
Virtual Visit via Video Note The purpose of this virtual visit is to provide medical care while limiting exposure to the novel coronavirus.    Consent was obtained for video visit:  yes Answered questions that patient had about telehealth interaction:  Yes I discussed the limitations, risks, security and privacy concerns of performing an evaluation and management service by telemedicine. I also discussed with the patient that there may be a patient responsible charge related to this service. The patient expressed understanding and agreed to proceed.  Pt location: Home Physician Location: Home Name of referring provider:  No ref. provider found I connected with Marissa Juarez at patients initiation/request on 07/04/2019 at  9:50 AM EDT by video enabled telemedicine application and verified that I am speaking with the correct person using two identifiers. Pt MRN:  [id number] Pt DOB:  02-Jun-1996 Video Participants:  Marissa Juarez   History of Present Illness:  Marissa Corbettis a 23 year old female who follows up for migraine.  UPDATE: Intensity:  Moderate to severe Duration:  2 hours Frequency:  1 to 2 days a month Frequency of abortive medication:Excedrin less than once a week Current NSAIDS:no Current analgesics: Excedrin Current triptans: no Current anti-emetic: no Current muscle relaxants: no Current anti-anxiolytic: no Current sleep aide: no Current Antihypertensive medications: no Current Antidepressant medications: nortriptyline75mg  Current Anticonvulsant medications: no Current Vitamins/Herbal/Supplements: no Current Antihistamines/Decongestants: no Other therapy: no Birth Control: Depo-Provera  Caffeine: Occasional coffee Alcohol: no Smoker: no Diet: Drinks32oz water daily. Does not drink soda Exercise: no Depression: no; Anxiety: no Other pain: Sometimes back pain Sleep hygiene: good  HISTORY: Onset:  Since  childhood,but has increased in frequency over past few months. Location: Left frontal region(above left eye) Quality: Throbbing/pounding Initial Intensity: Severe. she denies new headache, thunderclap headache or severe headache that wakes her from sleep. Aura: no Prodrome: no Postdrome: no Associated symptoms: Nausea, photophobia, phonophobia. Rarely associated with vomiting. Rarely associated with blurred vision only while driving. She denies associated unilateral numbness or weakness. Initial Duration: 2 hours Initial Frequency: Initially once a week but daily over past 1 to 2 months Initial Frequency of abortive medication: Excedrin daily Triggers:  Unknown but possibly stress.  Relieving factors: sleep Activity: aggravates  Past NSAIDS: no Past analgesics: no Past abortive triptans: no Past muscle relaxants: no Past anti-emetic: no Past antihypertensive medications: no Past antidepressant medications: no Past anticonvulsant medications: no Past vitamins/Herbal/Supplements: no Past antihistamines/decongestants: no Other past therapies: no  Family history of headache: Mom, cousins have migraines  Past Medical History: Past Medical History:  Diagnosis Date  . Headache     Medications: Outpatient Encounter Medications as of 07/04/2019  Medication Sig  . estradiol cypionate (DEPO-ESTRADIOL) 5 MG/ML injection Inject into the muscle every 28 (twenty-eight) days.  . nortriptyline (PAMELOR) 75 MG capsule Take 1 capsule (75 mg total) by mouth at bedtime.   No facility-administered encounter medications on file as of 07/04/2019.     Allergies: Allergies  Allergen Reactions  . Penicillins Rash    Family History: Family History  Problem Relation Age of Onset  . Heart murmur Sister   . Epilepsy Brother   . Dementia Maternal Grandmother   . Diabetes Maternal Grandfather   . Hypertension Paternal Grandmother   . Diabetes Paternal Grandmother    . Diabetes Paternal Grandfather     Social History: Social History   Socioeconomic History  . Marital status: Single    Spouse name: Not on file  . Number of children: Not on  file  . Years of education: Not on file  . Highest education level: Some college, no degree  Occupational History  . Occupation: Ship broker  Social Needs  . Financial resource strain: Not on file  . Food insecurity    Worry: Not on file    Inability: Not on file  . Transportation needs    Medical: Not on file    Non-medical: Not on file  Tobacco Use  . Smoking status: Never Smoker  . Smokeless tobacco: Never Used  Substance and Sexual Activity  . Alcohol use: No  . Drug use: Never  . Sexual activity: Not on file  Lifestyle  . Physical activity    Days per week: Not on file    Minutes per session: Not on file  . Stress: Not on file  Relationships  . Social Herbalist on phone: Not on file    Gets together: Not on file    Attends religious service: Not on file    Active member of club or organization: Not on file    Attends meetings of clubs or organizations: Not on file    Relationship status: Not on file  . Intimate partner violence    Fear of current or ex partner: Not on file    Emotionally abused: Not on file    Physically abused: Not on file    Forced sexual activity: Not on file  Other Topics Concern  . Not on file  Social History Narrative   Pt is left handed. She is single, lives with her mother, brother and sister. Drinks coffee and tea occasionally. No regular exercise recently. She is in school, studying for LPN.    Observations/Objective:   Height 5\' 8"  (1.727 m), weight 188 lb (85.3 kg). No acute distress.  Alert and oriented.  Speech fluent and not dysarthric.  Language intact.  Eyes orthophoric on primary gaze.  Face symmetric.  Assessment and Plan:   Migraine without aura, without status migrainosus, not intractable, stable  1.  For preventative management,  nortriptyline 75mg  at bedtime (refilled today) 2.  For abortive therapy, Excedrin 3.  Limit use of pain relievers to no more than 2 days out of week to prevent risk of rebound or medication-overuse headache. 4.  Keep headache diary 5.  Exercise, hydration, caffeine cessation, sleep hygiene, monitor for and avoid triggers 6.  Consider:  magnesium citrate 400mg  daily, riboflavin 400mg  daily, and coenzyme Q10 100mg  three times daily 7. Follow up one year   Follow Up Instructions:    -I discussed the assessment and treatment plan with the patient. The patient was provided an opportunity to ask questions and all were answered. The patient agreed with the plan and demonstrated an understanding of the instructions.   The patient was advised to call back or seek an in-person evaluation if the symptoms worsen or if the condition fails to improve as anticipated.     Dudley Major, DO

## 2019-07-03 ENCOUNTER — Encounter: Payer: Self-pay | Admitting: Neurology

## 2019-07-04 ENCOUNTER — Telehealth (INDEPENDENT_AMBULATORY_CARE_PROVIDER_SITE_OTHER): Payer: 59 | Admitting: Neurology

## 2019-07-04 ENCOUNTER — Encounter: Payer: Self-pay | Admitting: Neurology

## 2019-07-04 ENCOUNTER — Other Ambulatory Visit: Payer: Self-pay

## 2019-07-04 VITALS — Ht 68.0 in | Wt 188.0 lb

## 2019-07-04 DIAGNOSIS — G43009 Migraine without aura, not intractable, without status migrainosus: Secondary | ICD-10-CM | POA: Diagnosis not present

## 2019-07-04 MED ORDER — NORTRIPTYLINE HCL 75 MG PO CAPS: 75.0000 mg | ORAL_CAPSULE | Freq: Every day | ORAL | 3 refills | Status: AC

## 2019-07-19 DIAGNOSIS — Z113 Encounter for screening for infections with a predominantly sexual mode of transmission: Secondary | ICD-10-CM | POA: Diagnosis not present

## 2019-07-19 DIAGNOSIS — Z1389 Encounter for screening for other disorder: Secondary | ICD-10-CM | POA: Diagnosis not present

## 2019-07-19 DIAGNOSIS — Z309 Encounter for contraceptive management, unspecified: Secondary | ICD-10-CM | POA: Diagnosis not present

## 2019-10-04 DIAGNOSIS — R300 Dysuria: Secondary | ICD-10-CM | POA: Diagnosis not present

## 2019-10-04 DIAGNOSIS — Z1389 Encounter for screening for other disorder: Secondary | ICD-10-CM | POA: Diagnosis not present

## 2019-10-04 DIAGNOSIS — Z309 Encounter for contraceptive management, unspecified: Secondary | ICD-10-CM | POA: Diagnosis not present

## 2019-10-04 DIAGNOSIS — Z113 Encounter for screening for infections with a predominantly sexual mode of transmission: Secondary | ICD-10-CM | POA: Diagnosis not present

## 2019-10-08 DIAGNOSIS — A549 Gonococcal infection, unspecified: Secondary | ICD-10-CM | POA: Diagnosis not present

## 2019-10-10 DIAGNOSIS — Z113 Encounter for screening for infections with a predominantly sexual mode of transmission: Secondary | ICD-10-CM | POA: Diagnosis not present

## 2019-10-12 DIAGNOSIS — A549 Gonococcal infection, unspecified: Secondary | ICD-10-CM | POA: Diagnosis not present

## 2019-12-20 DIAGNOSIS — Z309 Encounter for contraceptive management, unspecified: Secondary | ICD-10-CM | POA: Diagnosis not present

## 2019-12-20 DIAGNOSIS — Z113 Encounter for screening for infections with a predominantly sexual mode of transmission: Secondary | ICD-10-CM | POA: Diagnosis not present

## 2019-12-20 DIAGNOSIS — Z23 Encounter for immunization: Secondary | ICD-10-CM | POA: Diagnosis not present

## 2020-01-23 DIAGNOSIS — Z23 Encounter for immunization: Secondary | ICD-10-CM | POA: Diagnosis not present

## 2020-03-20 DIAGNOSIS — Z309 Encounter for contraceptive management, unspecified: Secondary | ICD-10-CM | POA: Diagnosis not present

## 2020-06-05 DIAGNOSIS — Z3200 Encounter for pregnancy test, result unknown: Secondary | ICD-10-CM | POA: Diagnosis not present

## 2020-06-05 DIAGNOSIS — Z309 Encounter for contraceptive management, unspecified: Secondary | ICD-10-CM | POA: Diagnosis not present

## 2020-07-01 NOTE — Progress Notes (Deleted)
NEUROLOGY FOLLOW UP OFFICE NOTE  Marissa Juarez [id number]  HISTORY OF PRESENT ILLNESS: Marissa Juarez a78 year old female who follows up for migraines.  UPDATE: Intensity:  Moderate to severe Duration:  2 hours Frequency:  1 to 2 days a month Frequency of abortive medication:Excedrinless thanonce a week Current NSAIDS:no Current analgesics: Excedrin Current triptans: no Current anti-emetic: no Current muscle relaxants: no Current anti-anxiolytic: no Current sleep aide: no Current Antihypertensive medications: no Current Antidepressant medications: nortriptyline75mg  Current Anticonvulsant medications: no Current Vitamins/Herbal/Supplements: no Current Antihistamines/Decongestants: no Other therapy: no Birth Control: Depo-Provera  Caffeine: Occasional coffee Alcohol: no Smoker: no Diet: Drinks32oz water daily. Does not drink soda Exercise: no Depression: no; Anxiety: no Other pain: Sometimes back pain Sleep hygiene: good  HISTORY: Onset:  Since childhood,but has increased in frequency over past few months. Location: Left frontal region(above left eye) Quality: Throbbing/pounding Initial Intensity: Severe. she denies new headache, thunderclap headache or severe headache that wakes her from sleep. Aura: no Prodrome: no Postdrome: no Associated symptoms: Nausea, photophobia, phonophobia. Rarely associated with vomiting. Rarely associated with blurred vision only while driving. She denies associated unilateral numbness or weakness. Initial Duration: 2 hours Initial Frequency: Initially once a week but daily over past 1 to 2 months Initial Frequency of abortive medication: Excedrin daily Triggers:  Unknown but possibly stress.  Relieving factors: sleep Activity: aggravates  Past NSAIDS: no Past analgesics: no Past abortive triptans: no Past muscle relaxants: no Past anti-emetic: no Past  antihypertensive medications: no Past antidepressant medications: no Past anticonvulsant medications: no Past vitamins/Herbal/Supplements: no Past antihistamines/decongestants: no Other past therapies: no  Family history of headache: Mom, cousins have migraines  PAST MEDICAL HISTORY: Past Medical History:  Diagnosis Date   Headache     MEDICATIONS: Current Outpatient Medications on File Prior to Visit  Medication Sig Dispense Refill   estradiol cypionate (DEPO-ESTRADIOL) 5 MG/ML injection Inject into the muscle every 28 (twenty-eight) days.     nortriptyline (PAMELOR) 75 MG capsule Take 1 capsule (75 mg total) by mouth at bedtime. 90 capsule 3   No current facility-administered medications on file prior to visit.    ALLERGIES: Allergies  Allergen Reactions   Penicillins Rash    FAMILY HISTORY: Family History  Problem Relation Age of Onset   Heart murmur Sister    Epilepsy Brother    Dementia Maternal Grandmother    Diabetes Maternal Grandfather    Hypertension Paternal Grandmother    Diabetes Paternal Grandmother    Diabetes Paternal Grandfather    ***.  SOCIAL HISTORY: Social History   Socioeconomic History   Marital status: Single    Spouse name: Not on file   Number of children: Not on file   Years of education: Not on file   Highest education level: Some college, no degree  Occupational History   Occupation: student  Tobacco Use   Smoking status: Never Smoker   Smokeless tobacco: Never Used  Building services engineer Use: Never used  Substance and Sexual Activity   Alcohol use: No   Drug use: Never   Sexual activity: Not on file  Other Topics Concern   Not on file  Social History Narrative   Pt is left handed. She is single, lives with her mother, brother and sister.  No regular exercise recently. She is in school, studying for LPN. Not much caffeine - mostly water/ flavored water.   Social Determinants of Health    Financial Resource Strain:    Difficulty of Paying Living  Expenses: Not on file  Food Insecurity:    Worried About Programme researcher, broadcasting/film/video in the Last Year: Not on file   [employer] of Food in the Last Year: Not on file  Transportation Needs:    Lack of Transportation (Medical): Not on file   Lack of Transportation (Non-Medical): Not on file  Physical Activity:    Days of Exercise per Week: Not on file   Minutes of Exercise per Session: Not on file  Stress:    Feeling of Stress : Not on file  Social Connections:    Frequency of Communication with Friends and Family: Not on file   Frequency of Social Gatherings with Friends and Family: Not on file   Attends Religious Services: Not on file   Active Member of Clubs or Organizations: Not on file   Attends Banker Meetings: Not on file   Marital Status: Not on file  Intimate Partner Violence:    Fear of Current or Ex-Partner: Not on file   Emotionally Abused: Not on file   Physically Abused: Not on file   Sexually Abused: Not on file    PHYSICAL EXAM: *** General: No acute distress.  Patient appears well-groomed.   Head:  Normocephalic/atraumatic Eyes:  Fundi examined but not visualized Neck: supple, no paraspinal tenderness, full range of motion Heart:  Regular rate and rhythm Lungs:  Clear to auscultation bilaterally Back: No paraspinal tenderness Neurological Exam: alert and oriented to person, place, and time. Attention span and concentration intact, recent and remote memory intact, fund of knowledge intact.  Speech fluent and not dysarthric, language intact.  CN II-XII intact. Bulk and tone normal, muscle strength 5/5 throughout.  Sensation to light touch, temperature and vibration intact.  Deep tendon reflexes 2+ throughout, toes downgoing.  Finger to nose and heel to shin testing intact.  Gait normal, Romberg negative.  IMPRESSION: Migraine without aura  PLAN: 1.  For preventative management,  nortriptyline 75mg  at bedtime 2.  For abortive therapy, Excedrin 3.  Limit use of pain relievers to no more than 2 days out of week to prevent risk of rebound or medication-overuse headache. 4.  Keep headache diary 5.  Exercise, hydration, caffeine cessation, sleep hygiene, monitor for and avoid triggers 6.  Follow up ***   , DO

## 2020-07-03 ENCOUNTER — Ambulatory Visit: Payer: 59 | Admitting: Neurology

## 2020-08-28 DIAGNOSIS — Z23 Encounter for immunization: Secondary | ICD-10-CM | POA: Diagnosis not present

## 2020-08-28 DIAGNOSIS — Z309 Encounter for contraceptive management, unspecified: Secondary | ICD-10-CM | POA: Diagnosis not present

## 2020-08-28 DIAGNOSIS — Z3200 Encounter for pregnancy test, result unknown: Secondary | ICD-10-CM | POA: Diagnosis not present

## 2020-08-28 DIAGNOSIS — Z1389 Encounter for screening for other disorder: Secondary | ICD-10-CM | POA: Diagnosis not present

## 2020-09-15 DIAGNOSIS — Z124 Encounter for screening for malignant neoplasm of cervix: Secondary | ICD-10-CM | POA: Diagnosis not present

## 2020-09-15 DIAGNOSIS — Z113 Encounter for screening for infections with a predominantly sexual mode of transmission: Secondary | ICD-10-CM | POA: Diagnosis not present

## 2020-12-08 NOTE — Progress Notes (Deleted)
Virtual Visit via Video Note The purpose of this virtual visit is to provide medical care while limiting exposure to the novel coronavirus.    Consent was obtained for video visit:  Yes. Answered questions that patient had about telehealth interaction:  Yes.   I discussed the limitations, risks, security and privacy concerns of performing an evaluation and management service by telemedicine. I also discussed with the patient that there may be a patient responsible charge related to this service. The patient expressed understanding and agreed to proceed.  Pt location: Home Physician Location: office Name of referring provider:  No ref. provider found I connected with Imelda Pillow at patients initiation/request on 12/09/2020 at  3:30 PM EDT by video enabled telemedicine application and verified that I am speaking with the correct person using two identifiers. Pt MRN:  [id number] Pt DOB:  1995-11-21 Video Participants:  Birder Robson Sylvester  Assessment and Plan:   Migraine without aura, without status migrainosus, not intractable  1.  Migraine prevention:  Nortriptyline 75mg  QHS *** 2.  Migraine rescue:  Excedrin 3.  Limit use of pain relievers to no more than 2 days out of week to prevent risk of rebound or medication-overuse headache. 4.  Keep headache diary 5.  Follow up one year ***  History of Present Illness:  Marissa Corbettis a73 year old female who follows up for migraines.  UPDATE: Intensity:  Moderate to severe Duration:  2 hours Frequency:  1 to 2 days a month Frequency of abortive medication:Excedrinless thanonce a week Current NSAIDS:no Current analgesics: Excedrin Current triptans: no Current anti-emetic: no Current muscle relaxants: no Current anti-anxiolytic: no Current sleep aide: no Current Antihypertensive medications: no Current Antidepressant medications: nortriptyline75mg  Current Anticonvulsant medications: no Current  Vitamins/Herbal/Supplements: no Current Antihistamines/Decongestants: no Other therapy: no Birth Control: Depo-Provera  Caffeine: Occasional coffee Alcohol: no Smoker: no Diet: Drinks32oz water daily. Does not drink soda Exercise: no Depression: no; Anxiety: no Other pain: Sometimes back pain Sleep hygiene: good  HISTORY: Onset:  Since childhood,but has increased in frequency over past few months. Location: Left frontal region(above left eye) Quality: Throbbing/pounding Initial Intensity: Severe. she denies new headache, thunderclap headache or severe headache that wakes her from sleep. Aura: no Prodrome: no Postdrome: no Associated symptoms: Nausea, photophobia, phonophobia. Rarely associated with vomiting. Rarely associated with blurred vision only while driving. She denies associated unilateral numbness or weakness. Initial Duration: 2 hours Initial Frequency: Initially once a week but daily over past 1 to 2 months Initial Frequency of abortive medication: Excedrin daily Triggers:  Unknown but possibly stress.  Relieving factors: sleep Activity: aggravates  Past NSAIDS: no Past analgesics: no Past abortive triptans: no Past muscle relaxants: no Past anti-emetic: no Past antihypertensive medications: no Past antidepressant medications: no Past anticonvulsant medications: no Past vitamins/Herbal/Supplements: no Past antihistamines/decongestants: no Other past therapies: no  Family history of headache: Mom, cousins have migraines  Past Medical History: Past Medical History:  Diagnosis Date  . Headache     Medications: Outpatient Encounter Medications as of 12/09/2020  Medication Sig  . estradiol cypionate (DEPO-ESTRADIOL) 5 MG/ML injection Inject into the muscle every 28 (twenty-eight) days.  . nortriptyline (PAMELOR) 75 MG capsule Take 1 capsule (75 mg total) by mouth at bedtime.   No facility-administered  encounter medications on file as of 12/09/2020.    Allergies: Allergies  Allergen Reactions  . Penicillins Rash    Family History: Family History  Problem Relation Age of Onset  . Heart murmur Sister   . Epilepsy  Brother   . Dementia Maternal Grandmother   . Diabetes Maternal Grandfather   . Hypertension Paternal Grandmother   . Diabetes Paternal Grandmother   . Diabetes Paternal Grandfather     Observations/Objective:   *** No acute distress.  Alert and oriented.  Speech fluent and not dysarthric.  Language intact.  Eyes orthophoric on primary gaze.  Face symmetric.   Follow Up Instructions:    -I discussed the assessment and treatment plan with the patient. The patient was provided an opportunity to ask questions and all were answered. The patient agreed with the plan and demonstrated an understanding of the instructions.   The patient was advised to call back or seek an in-person evaluation if the symptoms worsen or if the condition fails to improve as anticipated.   Cira Servant, DO

## 2020-12-09 ENCOUNTER — Telehealth: Payer: 59 | Admitting: Neurology

## 2021-01-31 ENCOUNTER — Emergency Department: Payer: Worker's Compensation

## 2021-01-31 ENCOUNTER — Emergency Department
Admission: EM | Admit: 2021-01-31 | Discharge: 2021-01-31 | Disposition: A | Discharge status: Home / Self Care | Payer: Worker's Compensation | Attending: Emergency Medicine | Admitting: Emergency Medicine

## 2021-01-31 ENCOUNTER — Other Ambulatory Visit: Payer: Self-pay

## 2021-01-31 DIAGNOSIS — Z79899 Other long term (current) drug therapy: Secondary | ICD-10-CM | POA: Diagnosis not present

## 2021-01-31 DIAGNOSIS — S99912A Unspecified injury of left ankle, initial encounter: Secondary | ICD-10-CM | POA: Diagnosis present

## 2021-01-31 DIAGNOSIS — S93402A Sprain of unspecified ligament of left ankle, initial encounter: Secondary | ICD-10-CM | POA: Insufficient documentation

## 2021-01-31 DIAGNOSIS — Y990 Civilian activity done for income or pay: Secondary | ICD-10-CM | POA: Diagnosis not present

## 2021-01-31 MED ORDER — NAPROXEN 500 MG PO TABS: 500.0000 mg | ORAL_TABLET | Freq: Two times a day (BID) | ORAL | Status: AC

## 2021-01-31 MED ORDER — BACITRACIN-NEOMYCIN-POLYMYXIN 400-5-5000 EX OINT
TOPICAL_OINTMENT | Freq: Once | CUTANEOUS | Status: AC
  Filled 2021-01-31: qty 1

## 2021-01-31 NOTE — ED Notes (Signed)
Pt states she will be filing under WC. Profile printed out. Pt provided her bosses number if needed. Asked Les, ED medic to complete WC at this time.

## 2021-01-31 NOTE — ED Triage Notes (Signed)
Pt states she was at work and fell and hurt her left ankle- pt states she was stepping out of the [employer] and fell- pt denies hitting her head- pt left ankle wrapped in ace bandage

## 2021-01-31 NOTE — ED Provider Notes (Addendum)
[facility] Emergency Department Provider Note   ____________________________________________   Event Date/Time   First MD Initiated Contact with Patient 01/31/21 1320     (approximate)  I have reviewed the triage vital signs and the nursing notes.   HISTORY  Chief Complaint Ankle Pain    HPI Marissa Juarez is a 25 y.o. female patient complain of left ankle pain secondary secondary to stepping off an [employer] in her ankle and landed on her left knee.  Patient Ace wrap and dressed her injuries prior to coming to the emergency room.           Past Medical History:  Diagnosis Date  . Headache     There are no problems to display for this patient.   History reviewed. No pertinent surgical history.  Prior to Admission medications   Medication Sig Start Date End Date Taking? Authorizing Provider  naproxen (NAPROSYN) 500 MG tablet Take 1 tablet (500 mg total) by mouth 2 (two) times daily with a meal. 01/31/21  Yes Joni Reining, PA-C  estradiol cypionate (DEPO-ESTRADIOL) 5 MG/ML injection Inject into the muscle every 28 (twenty-eight) days.    [provider]  nortriptyline (PAMELOR) 75 MG capsule Take 1 capsule (75 mg total) by mouth at bedtime. 07/04/19   Drema Dallas, DO    Allergies Penicillins  Family History  Problem Relation Age of Onset  . Heart murmur Sister   . Epilepsy Brother   . Dementia Maternal Grandmother   . Diabetes Maternal Grandfather   . Hypertension Paternal Grandmother   . Diabetes Paternal Grandmother   . Diabetes Paternal Grandfather     Social History Social History   Tobacco Use  . Smoking status: Never Smoker  . Smokeless tobacco: Never Used  Vaping Use  . Vaping Use: Never used  Substance Use Topics  . Alcohol use: No  . Drug use: Never    Review of Systems Constitutional: No fever/chills Eyes: No visual changes. ENT: No sore throat. Cardiovascular: Denies chest pain. Respiratory:  Denies shortness of breath. Gastrointestinal: No abdominal pain.  No nausea, no vomiting.  No diarrhea.  No constipation. Genitourinary: Negative for dysuria. Musculoskeletal: Positive for left ankle and right knee pain. Skin: Negative for rash.  Abrasion right anterior knee Neurological: Negative for headaches, focal weakness or numbness. Allergic/Immunilogical: Penicillin ____________________________________________   PHYSICAL EXAM:  VITAL SIGNS: ED Triage Vitals  Enc Vitals Group     BP 01/31/21 1257 127/77     Pulse Rate 01/31/21 1257 86     Resp 01/31/21 1257 16     Temp 01/31/21 1257 98.6 F (37 C)     Temp Source 01/31/21 1257 Oral     SpO2 01/31/21 1257 97 %     Weight 01/31/21 1255 209 lb (94.8 kg)     Height 01/31/21 1255 5\' 8"  (1.727 m)     Head Circumference --      Peak Flow --      Pain Score 01/31/21 1255 10     Pain Loc --      Pain Edu? --      Excl. in GC? --     Constitutional: Alert and oriented. Well appearing and in no acute distress. Eyes: Conjunctivae are normal. PERRL. EOMI. Head: Atraumatic. Nose: No congestion/rhinnorhea. Mouth/Throat: Mucous membranes are moist.  Oropharynx non-erythematous. Neck: No stridor.  No cervical spine tenderness to palpation. Hematological/Lymphatic/Immunilogical: No cervical lymphadenopathy. Cardiovascular: Normal rate, regular rhythm. Grossly normal heart  sounds.  Good peripheral circulation. Respiratory: Normal respiratory effort.  No retractions. Lungs CTAB. Gastrointestinal: Soft and nontender. No distention. No abdominal bruits. No CVA tenderness. Musculoskeletal: No lower extremity tenderness nor edema.  No joint effusions. Neurologic:  Normal speech and language. No gross focal neurologic deficits are appreciated. No gait instability. Skin:  Skin is warm, dry and intact. No rash noted.  Abrasion to right anterior knee Psychiatric: Mood and affect are normal. Speech and behavior are  normal.  ____________________________________________   LABS (all labs ordered are listed, but only abnormal results are displayed)  Labs Reviewed - No data to display ____________________________________________  EKG   ____________________________________________  RADIOLOGY I, Joni Reining, personally viewed and evaluated these images (plain radiographs) as part of my medical decision making, as well as reviewing the written report by the radiologist.  ED MD interpretation:    Official radiology report(s): DG Ankle Complete Left  Result Date: 01/31/2021 CLINICAL DATA:  Pt states she was at work and fell and hurt her left ankle- pt states she was stepping out of the [employer] and fellSts lateral malleolus, unable to dorsiflex foot EXAM: LEFT ANKLE COMPLETE - 3+ VIEW COMPARISON:  None. FINDINGS: No fracture or bone lesion. Ankle joint normally spaced and aligned.  No arthropathic changes. Lateral soft tissue swelling. IMPRESSION: No fracture or dislocation. Electronically Signed   By: Amie Portland M.D.   On: 01/31/2021 14:51    ____________________________________________   PROCEDURES  Procedure(s) performed (including Critical Care):  .Splint Application  Date/Time: 01/31/2021 3:26 PM Performed by: Joni Reining, PA-C Authorized by: Joni Reining, PA-C   Consent:    Consent obtained:  Verbal   Consent given by:  Patient   Risks, benefits, and alternatives were discussed: yes     Risks discussed:  Numbness, discoloration, pain and swelling Universal protocol:    Patient identity confirmed:  Verbally with patient and arm band Pre-procedure details:    Distal neurologic exam:  Normal   Distal perfusion: distal pulses strong   Procedure details:    Location:  Ankle   Ankle location:  L ankle   Strapping: no     Supplies:  Prefabricated splint Post-procedure details:    Distal perfusion: distal pulses strong     Procedure completion:  Tolerated well, no  immediate complications     ____________________________________________   INITIAL IMPRESSION / ASSESSMENT AND PLAN / ED COURSE  As part of my medical decision making, I reviewed the following data within the electronic MEDICAL RECORD NUMBER         Patient presents with right ankle pain and left knee pain secondary to a fall which occurred prior to arrival.  Discussed no acute findings on x-ray of the left ankle.  Patient complaint physical exam consistent with left ankle sprain with contusion and abrasion to the left knee.  Patient placed in ankle splint and given crutches to assist with ambulation.  Patient given a work note for 3 days and advised take medication as directed.  Return to ED if condition worsens.      ____________________________________________   FINAL CLINICAL IMPRESSION(S) / ED DIAGNOSES  Final diagnoses:  Sprain of left ankle, unspecified ligament, initial encounter     ED Discharge Orders         Ordered    naproxen (NAPROSYN) 500 MG tablet  2 times daily with meals        01/31/21 1521          *Please  note:  XIA STOHR was evaluated in Emergency Department on 01/31/2021 for the symptoms described in the history of present illness. She was evaluated in the context of the global COVID-19 pandemic, which necessitated consideration that the patient might be at risk for infection with the SARS-CoV-2 virus that causes COVID-19. Institutional protocols and algorithms that pertain to the evaluation of patients at risk for COVID-19 are in a state of rapid change based on information released by regulatory bodies including the CDC and federal and state organizations. These policies and algorithms were followed during the patient's care in the ED.  Some ED evaluations and interventions may be delayed as a result of limited staffing during and the pandemic.*   Note:  This document was prepared using Dragon voice recognition software and may include unintentional  dictation errors.    Joni Reining, PA-C 01/31/21 1527    Joni Reining, PA-C 01/31/21 1529    Sharyn Creamer, MD 01/31/21 1714

## 2021-01-31 NOTE — Discharge Instructions (Signed)
Read and follow discharge care instructions.Wear ankle splint and ambulate with crutches for 2 to 3 days.

## 2021-02-09 ENCOUNTER — Ambulatory Visit: Payer: Self-pay | Admitting: *Deleted

## 2021-02-09 NOTE — Telephone Encounter (Signed)
C/o continued ankle swelling and pain since injuring left ankle and spraining ankle on 01/31/21. Patient went to ED 01/31/21 . Xray result no fracture or dislocation. Patient reports ankle remains swollen and pain . Naproxen not as effective during the day makes her sleepy. Patient able to put a little pressure on ankle and walk but causes pain.  Left outer ankle area remains discolored purple . Denies fever. encouraged patient to notify PCP for pain management. Care advise given. Patient verbalized understanding of care advise and to call back or go to [facility] or ED if symptoms worsen.    Reason for Disposition . [1] MODERATE pain (e.g., interferes with normal activities, limping) AND [2] present > 3 days  Answer Assessment - Initial Assessment Questions 1. LOCATION: "Which ankle is swollen?" "Where is the swelling?"     Left ankle outside area 2. ONSET: "When did the swelling start?"     01/31/21 3. SIZE: "How large is the swelling?"     Same as when sprain happened 4. PAIN: "Is there any pain?" If Yes, ask: "How bad is it?" (Scale 1-10; or mild, moderate, severe)   - NONE (0): no pain.   - MILD (1-3): doesn't interfere with normal activities.    - MODERATE (4-7): interferes with normal activities (e.g., work or school) or awakens from sleep, limping.    - SEVERE (8-10): excruciating pain, unable to do any normal activities, unable to walk.      Continued pain but can walk and put pressure on ankle  5. CAUSE: "What do you think caused the ankle swelling?"     Injury stepping out of [employer]  6. OTHER SYMPTOMS: "Do you have any other symptoms?" (e.g., fever, chest pain, difficulty breathing, calf pain)     Pain in ankle  7. PREGNANCY: "Is there any chance you are pregnant?" "When was your last menstrual period?"     na  Protocols used: ANKLE [facility]

## 2021-03-02 NOTE — Progress Notes (Deleted)
Virtual Visit via Video Note The purpose of this virtual visit is to provide medical care while limiting exposure to the novel coronavirus.    Consent was obtained for video visit:  Yes.   Answered questions that patient had about telehealth interaction:  Yes.   I discussed the limitations, risks, security and privacy concerns of performing an evaluation and management service by telemedicine. I also discussed with the patient that there may be a patient responsible charge related to this service. The patient expressed understanding and agreed to proceed.  Pt location: Home Physician Location: office Name of referring provider:  No ref. provider found I connected with Marissa Juarez at patients initiation/request on 03/04/2021 at  8:30 AM EDT by video enabled telemedicine application and verified that I am speaking with the correct person using two identifiers. Pt MRN:  [id number] Pt DOB:  09/21/1996 Video Participants:  Marissa Juarez  Assessment and Plan:   Migraine without aura  1.  Migraine prevention:  Nortriptyline 75mg  QHS 2.  Migraine rescue:  Excedrin 3.  Limit use of pain relievers to no more than 2 days out of week to prevent risk of rebound or medication-overuse headache. 4.  Keep headache diary 5.  Follow up ***  History of Present Illness:  Marissa Corbettis a44 year old female who follows up for migraine.  UPDATE: Intensity:  Moderate to severe Duration:  2 hours Frequency:  1 to 2 days a month Frequency of abortive medication:Excedrinless thanonce a week Current NSAIDS:no Current analgesics: Excedrin Current triptans: no Current anti-emetic: no Current muscle relaxants: no Current anti-anxiolytic: no Current sleep aide: no Current Antihypertensive medications: no Current Antidepressant medications: nortriptyline75mg  Current Anticonvulsant medications: no Current Vitamins/Herbal/Supplements: no Current Antihistamines/Decongestants:  no Other therapy: no Birth Control: Depo-Provera  Caffeine: Occasional coffee Alcohol: no Smoker: no Diet: Drinks32oz water daily. Does not drink soda Exercise: no Depression: no; Anxiety: no Other pain: Sometimes back pain Sleep hygiene: good  HISTORY: Onset:  Since childhood,but has increased in frequency over past few months. Location: Left frontal region(above left eye) Quality: Throbbing/pounding Initial Intensity: Severe. she denies new headache, thunderclap headache or severe headache that wakes her from sleep. Aura: no Prodrome: no Postdrome: no Associated symptoms: Nausea, photophobia, phonophobia. Rarely associated with vomiting. Rarely associated with blurred vision only while driving. She denies associated unilateral numbness or weakness. Initial Duration: 2 hours Initial Frequency: Initially once a week but daily over past 1 to 2 months Initial Frequency of abortive medication: Excedrin daily Triggers:  Unknown but possibly stress.  Relieving factors: sleep Activity: aggravates  Past NSAIDS: no Past analgesics: no Past abortive triptans: no Past muscle relaxants: no Past anti-emetic: no Past antihypertensive medications: no Past antidepressant medications: no Past anticonvulsant medications: no Past vitamins/Herbal/Supplements: no Past antihistamines/decongestants: no Other past therapies: no  Family history of headache: Mom, cousins have migraines   Past Medical History: Past Medical History:  Diagnosis Date  . Headache     Medications: Outpatient Encounter Medications as of 03/04/2021  Medication Sig  . estradiol cypionate (DEPO-ESTRADIOL) 5 MG/ML injection Inject into the muscle every 28 (twenty-eight) days.  . naproxen (NAPROSYN) 500 MG tablet Take 1 tablet (500 mg total) by mouth 2 (two) times daily with a meal.  . nortriptyline (PAMELOR) 75 MG capsule Take 1 capsule (75 mg total) by mouth at  bedtime.   No facility-administered encounter medications on file as of 03/04/2021.    Allergies: Allergies  Allergen Reactions  . Penicillins Rash    Family History:  Family History  Problem Relation Age of Onset  . Heart murmur Sister   . Epilepsy Brother   . Dementia Maternal Grandmother   . Diabetes Maternal Grandfather   . Hypertension Paternal Grandmother   . Diabetes Paternal Grandmother   . Diabetes Paternal Grandfather     Observations/Objective:   *** No acute distress.  Alert and oriented.  Speech fluent and not dysarthric.  Language intact.  Eyes orthophoric on primary gaze.  Face symmetric.   Follow Up Instructions:    -I discussed the assessment and treatment plan with the patient. The patient was provided an opportunity to ask questions and all were answered. The patient agreed with the plan and demonstrated an understanding of the instructions.   The patient was advised to call back or seek an in-person evaluation if the symptoms worsen or if the condition fails to improve as anticipated.  Cira Servant, DO

## 2021-03-04 ENCOUNTER — Telehealth: Payer: 59 | Admitting: Neurology

## 2021-07-27 DIAGNOSIS — R197 Diarrhea, unspecified: Secondary | ICD-10-CM | POA: Insufficient documentation

## 2021-07-27 DIAGNOSIS — R109 Unspecified abdominal pain: Secondary | ICD-10-CM | POA: Diagnosis not present

## 2021-07-27 DIAGNOSIS — Z5321 Procedure and treatment not carried out due to patient leaving prior to being seen by health care provider: Secondary | ICD-10-CM | POA: Diagnosis not present

## 2021-07-27 DIAGNOSIS — R1110 Vomiting, unspecified: Secondary | ICD-10-CM | POA: Insufficient documentation

## 2021-07-27 LAB — URINALYSIS, COMPLETE (UACMP) WITH MICROSCOPIC
Bilirubin Urine: NEGATIVE
Glucose, UA: NEGATIVE mg/dL
Ketones, ur: NEGATIVE mg/dL
Leukocytes,Ua: NEGATIVE
Nitrite: NEGATIVE
Protein, ur: NEGATIVE mg/dL
Specific Gravity, Urine: 1.004 — ABNORMAL LOW (ref 1.005–1.030)
pH: 6 (ref 5.0–8.0)

## 2021-07-27 LAB — CBC WITH DIFFERENTIAL/PLATELET
Abs Immature Granulocytes: 0.02 10*3/uL (ref 0.00–0.07)
Basophils Absolute: 0 10*3/uL (ref 0.0–0.1)
Basophils Relative: 0 %
Eosinophils Absolute: 0.1 10*3/uL (ref 0.0–0.5)
Eosinophils Relative: 1 %
HCT: 46.2 % — ABNORMAL HIGH (ref 36.0–46.0)
Hemoglobin: 15.6 g/dL — ABNORMAL HIGH (ref 12.0–15.0)
Immature Granulocytes: 0 %
Lymphocytes Relative: 35 %
Lymphs Abs: 2.6 10*3/uL (ref 0.7–4.0)
MCH: 27.9 pg (ref 26.0–34.0)
MCHC: 33.8 g/dL (ref 30.0–36.0)
MCV: 82.6 fL (ref 80.0–100.0)
Monocytes Absolute: 0.5 10*3/uL (ref 0.1–1.0)
Monocytes Relative: 7 %
Neutro Abs: 4.2 10*3/uL (ref 1.7–7.7)
Neutrophils Relative %: 57 %
Platelets: 377 10*3/uL (ref 150–400)
RBC: 5.59 MIL/uL — ABNORMAL HIGH (ref 3.87–5.11)
RDW: 13.2 % (ref 11.5–15.5)
WBC: 7.5 10*3/uL (ref 4.0–10.5)
nRBC: 0 % (ref 0.0–0.2)

## 2021-07-27 LAB — COMPREHENSIVE METABOLIC PANEL
ALT: 26 U/L (ref 0–44)
AST: 19 U/L (ref 15–41)
Albumin: 4.1 g/dL (ref 3.5–5.0)
Alkaline Phosphatase: 61 U/L (ref 38–126)
Anion gap: 7 (ref 5–15)
BUN: 12 mg/dL (ref 6–20)
CO2: 24 mmol/L (ref 22–32)
Calcium: 9 mg/dL (ref 8.9–10.3)
Chloride: 108 mmol/L (ref 98–111)
Creatinine, Ser: 0.8 mg/dL (ref 0.44–1.00)
GFR, Estimated: >60 mL/min (ref >60)
Glucose, Bld: 90 mg/dL (ref 70–99)
Potassium: 3.9 mmol/L (ref 3.5–5.1)
Sodium: 139 mmol/L (ref 135–145)
Total Bilirubin: 0.3 mg/dL (ref 0.3–1.2)
Total Protein: 7.7 g/dL (ref 6.5–8.1)

## 2021-07-27 LAB — LIPASE, BLOOD: Lipase: 39 U/L (ref 11–51)

## 2021-07-27 LAB — PREGNANCY, URINE: Preg Test, Ur: NEGATIVE

## 2021-07-27 NOTE — ED Provider Notes (Signed)
Emergency Medicine Provider Triage Evaluation Note  Marissa Juarez , a 25 y.o. female  was evaluated in triage.  Pt complains of periumbilical abdominal pain, vomiting and diarrhea that been ongoing since Wednesday.  Patient is unsure of fever.  She denies dysuria, hematuria or increased urinary frequency.  No sick contacts in the home with similar symptoms.  Denies possibility of pregnancy.  No rhinorrhea, nasal congestion or nonproductive cough.  Review of Systems  Positive: Patient has vomiting and diarrhea.  Negative: No chest pain, chest tightness or abdominal pain.  Physical Exam  BP 139/78 (BP Location: Left Arm)   Pulse 79   Temp 98.2 F (36.8 C) (Oral)   Resp 20   Ht 5\' 8"  (1.727 m)   Wt 92.5 kg   SpO2 100%   BMI 31.02 kg/m  Gen:   Awake, no distress   Resp:  Normal effort  MSK:   Moves extremities without difficulty  Other:    Medical Decision Making  Medically screening exam initiated at 4:50 PM.  Appropriate orders placed.  KYNSLEIGH WESTENDORF was informed that the remainder of the evaluation will be completed by another provider, this initial triage assessment does not replace that evaluation, and the importance of remaining in the ED until their evaluation is complete.     Marissa Juarez [place], PA-C 07/27/21 1651    07/29/21, MD 07/28/21 [phone]

## 2021-07-28 ENCOUNTER — Emergency Department
Admission: EM | Admit: 2021-07-28 | Discharge: 2021-07-28 | Disposition: A | Discharge status: Home / Self Care | Payer: 59 | Attending: Emergency Medicine | Admitting: Emergency Medicine

## 2021-07-28 DIAGNOSIS — R112 Nausea with vomiting, unspecified: Secondary | ICD-10-CM | POA: Diagnosis not present

## 2021-07-28 DIAGNOSIS — R197 Diarrhea, unspecified: Secondary | ICD-10-CM | POA: Diagnosis not present

## 2021-07-28 NOTE — ED Notes (Signed)
No answer when called several times from lobby 

## 2022-12-17 IMAGING — DX DG ANKLE COMPLETE 3+V*L*
3 series · 3 of 3 positions shown · non-contrast
Comparison: None.

CLINICAL DATA: Pt states she was at work and fell and hurt her left
ankle- pt states she was stepping out of the [employer] and fellSts
lateral malleolus, unable to dorsiflex foot

EXAM:
LEFT ANKLE COMPLETE - 3+ VIEW

[ankle ap]
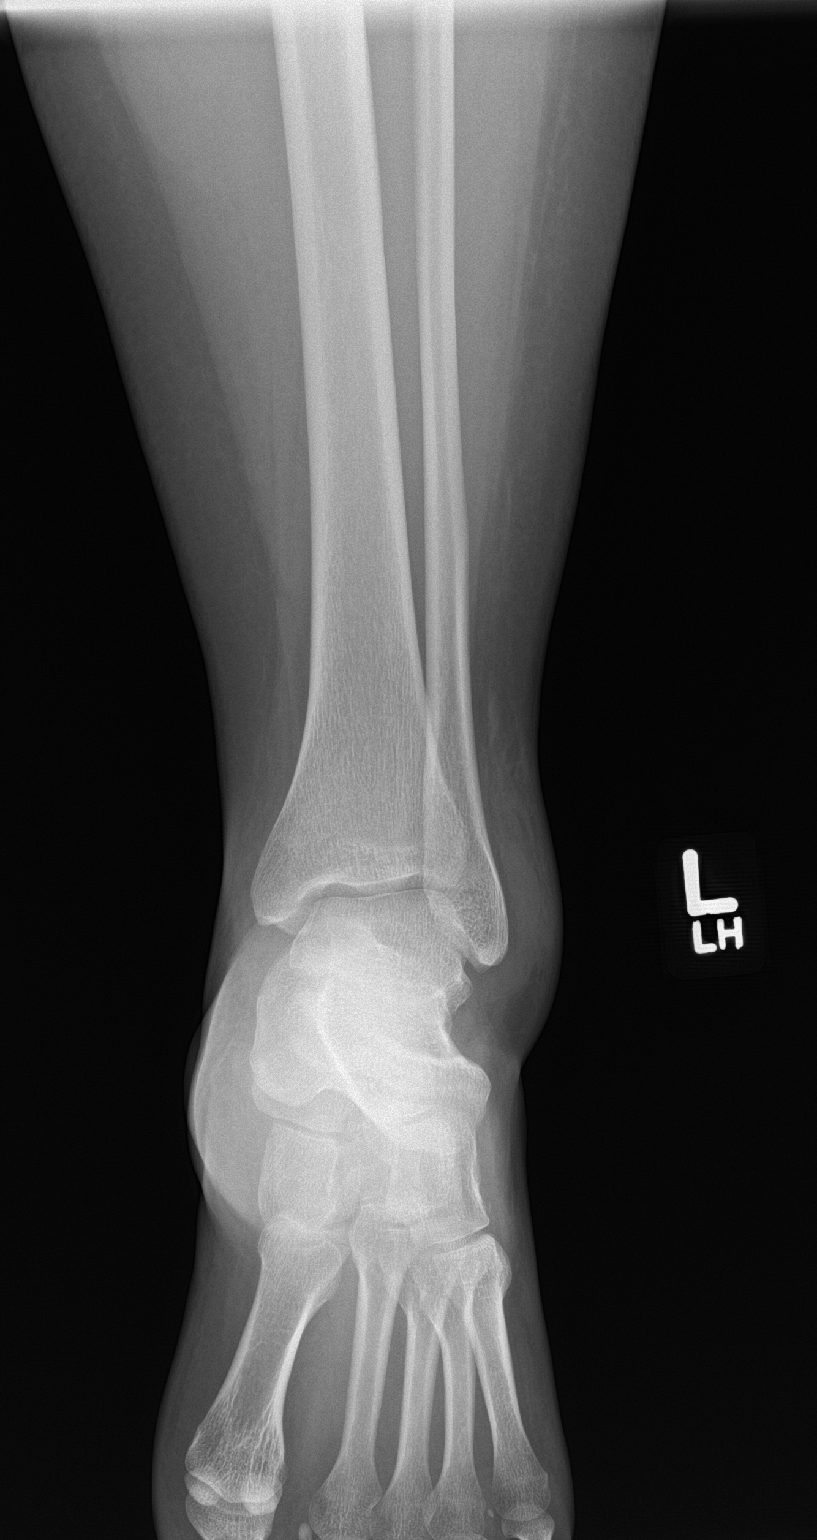

[ankle obl]
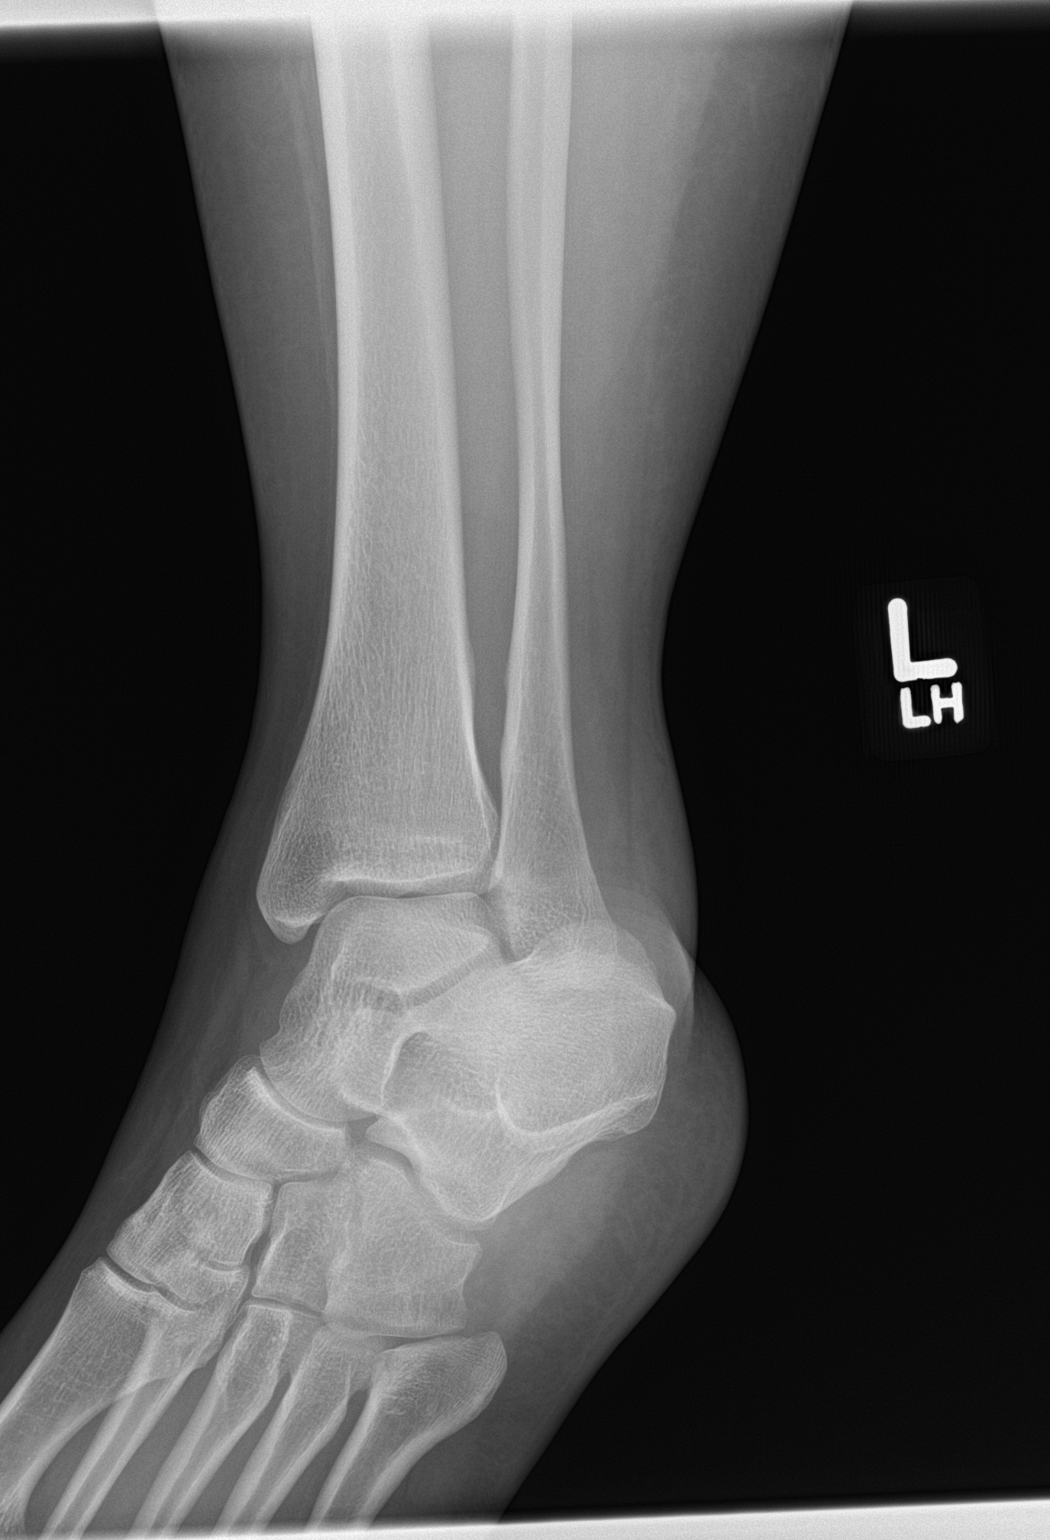

[ankle lat]
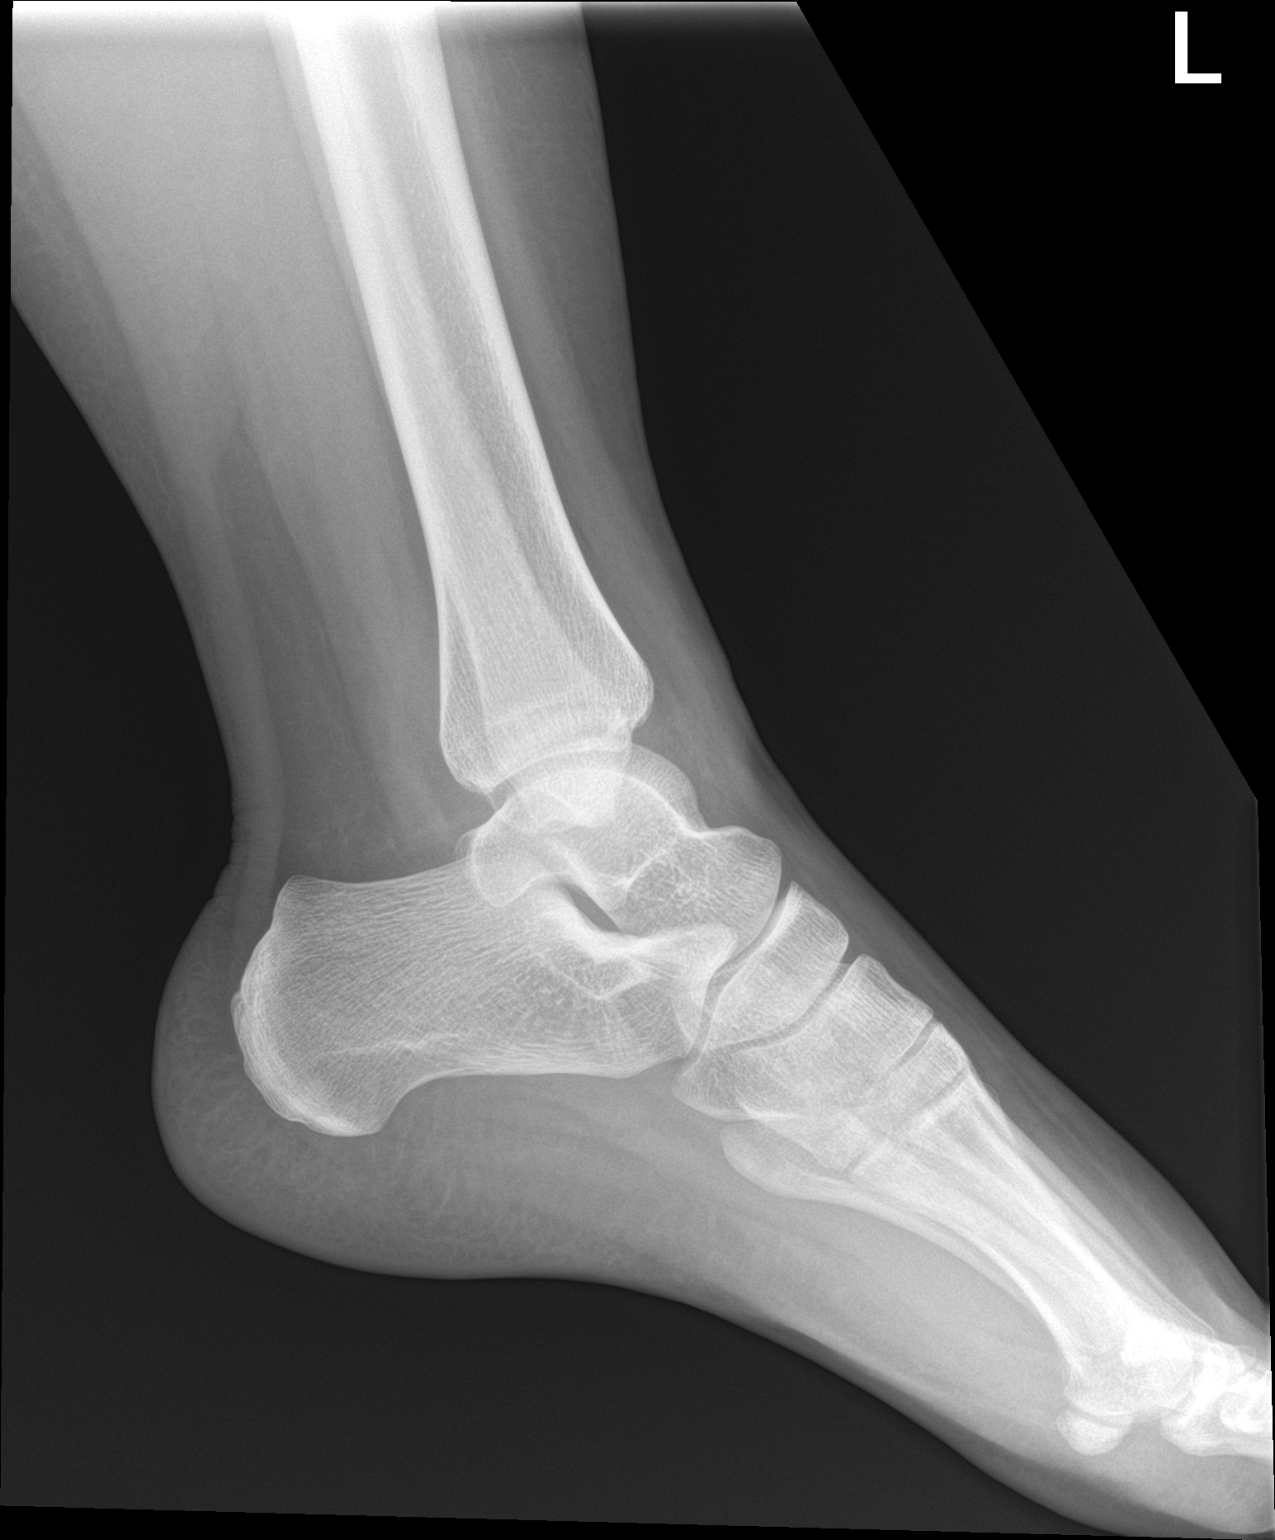

[3 of 3 positions shown; findings below may reference images not displayed]

FINDINGS: No fracture or bone lesion.

Ankle joint normally spaced and aligned.  No arthropathic changes.

Lateral soft tissue swelling.
IMPRESSION: No fracture or dislocation.

## 2024-04-24 ENCOUNTER — Other Ambulatory Visit: Payer: Self-pay | Admitting: Medical Genetics

## 2024-07-06 ENCOUNTER — Other Ambulatory Visit: Payer: Self-pay | Admitting: Medical Genetics

## 2024-07-06 DIAGNOSIS — Z006 Encounter for examination for normal comparison and control in clinical research program: Secondary | ICD-10-CM

## 2024-07-24 LAB — GENECONNECT MOLECULAR SCREEN: Genetic Analysis Overall Interpretation: NEGATIVE
